# Patient Record
Sex: Male | Born: 1965
Health system: Southern US, Community
[De-identification: ages and names within clinical notes are randomized; demographics above are authoritative.]

## PROBLEM LIST (undated history)

## (undated) DIAGNOSIS — K219 Gastro-esophageal reflux disease without esophagitis: Secondary | ICD-10-CM

## (undated) DIAGNOSIS — E785 Hyperlipidemia, unspecified: Secondary | ICD-10-CM

## (undated) DIAGNOSIS — K200 Eosinophilic esophagitis: Secondary | ICD-10-CM

## (undated) DIAGNOSIS — F419 Anxiety disorder, unspecified: Secondary | ICD-10-CM

## (undated) HISTORY — DX: Hyperlipidemia, unspecified: E78.5

## (undated) HISTORY — DX: Gastro-esophageal reflux disease without esophagitis: K21.9

## (undated) HISTORY — DX: Anxiety disorder, unspecified: F41.9

## (undated) HISTORY — DX: Eosinophilic esophagitis: K20.0

---

## 1987-05-01 HISTORY — PX: INGUINAL HERNIA REPAIR: SUR1180

## 2007-05-08 ENCOUNTER — Ambulatory Visit: Payer: Self-pay | Admitting: Family Medicine

## 2007-05-08 DIAGNOSIS — F40298 Other specified phobia: Secondary | ICD-10-CM | POA: Insufficient documentation

## 2007-05-08 DIAGNOSIS — K219 Gastro-esophageal reflux disease without esophagitis: Secondary | ICD-10-CM

## 2007-05-08 DIAGNOSIS — E785 Hyperlipidemia, unspecified: Secondary | ICD-10-CM

## 2007-05-08 DIAGNOSIS — J309 Allergic rhinitis, unspecified: Secondary | ICD-10-CM

## 2007-05-08 LAB — CONVERTED CEMR LAB
AST: 29 units/L (ref 0–37)
Albumin: 4.3 g/dL (ref 3.5–5.2)
Alkaline Phosphatase: 56 units/L (ref 39–117)
BUN: 15 mg/dL (ref 6–23)
Basophils Absolute: 0 10*3/uL (ref 0.0–0.1)
Chloride: 102 meq/L (ref 96–112)
Cholesterol: 184 mg/dL (ref 0–200)
Creatinine, Ser: 1 mg/dL (ref 0.4–1.5)
HCT: 42 % (ref 39.0–52.0)
LDL Cholesterol: 110 mg/dL — ABNORMAL HIGH (ref 0–99)
MCHC: 34.6 g/dL (ref 30.0–36.0)
Neutrophils Relative %: 52.5 % (ref 43.0–77.0)
Nitrite: NEGATIVE
Protein, U semiquant: NEGATIVE
RBC: 4.75 M/uL (ref 4.22–5.81)
RDW: 11.9 % (ref 11.5–14.6)
Sodium: 139 meq/L (ref 135–145)
Total Bilirubin: 1 mg/dL (ref 0.3–1.2)
Total CHOL/HDL Ratio: 3.7
Urobilinogen, UA: 0.2
VLDL: 25 mg/dL (ref 0–40)
WBC Urine, dipstick: NEGATIVE

## 2007-05-29 ENCOUNTER — Ambulatory Visit: Payer: Self-pay | Admitting: Gastroenterology

## 2007-06-06 ENCOUNTER — Ambulatory Visit: Payer: Self-pay | Admitting: Gastroenterology

## 2007-07-02 DIAGNOSIS — J029 Acute pharyngitis, unspecified: Secondary | ICD-10-CM

## 2007-07-04 ENCOUNTER — Ambulatory Visit: Payer: Self-pay | Admitting: Family Medicine

## 2007-07-04 DIAGNOSIS — L738 Other specified follicular disorders: Secondary | ICD-10-CM

## 2007-07-10 ENCOUNTER — Encounter: Payer: Self-pay | Admitting: Family Medicine

## 2007-07-15 ENCOUNTER — Ambulatory Visit: Payer: Self-pay | Admitting: Gastroenterology

## 2007-09-05 ENCOUNTER — Ambulatory Visit: Payer: Self-pay | Admitting: Gastroenterology

## 2007-10-22 ENCOUNTER — Encounter: Payer: Self-pay | Admitting: Family Medicine

## 2007-11-06 DIAGNOSIS — K200 Eosinophilic esophagitis: Secondary | ICD-10-CM

## 2007-11-07 ENCOUNTER — Ambulatory Visit: Payer: Self-pay | Admitting: Gastroenterology

## 2007-12-09 ENCOUNTER — Telehealth: Payer: Self-pay | Admitting: Gastroenterology

## 2008-06-11 ENCOUNTER — Telehealth: Payer: Self-pay | Admitting: *Deleted

## 2008-07-14 ENCOUNTER — Ambulatory Visit: Payer: Self-pay | Admitting: Family Medicine

## 2008-07-14 LAB — CONVERTED CEMR LAB
Alkaline Phosphatase: 45 units/L (ref 39–117)
Basophils Relative: 1.1 % (ref 0.0–3.0)
Bilirubin, Direct: 0 mg/dL (ref 0.0–0.3)
Blood in Urine, dipstick: NEGATIVE
Calcium: 9.3 mg/dL (ref 8.4–10.5)
Creatinine, Ser: 1 mg/dL (ref 0.4–1.5)
Eosinophils Absolute: 0.4 10*3/uL (ref 0.0–0.7)
Eosinophils Relative: 10.1 % — ABNORMAL HIGH (ref 0.0–5.0)
HDL: 46.8 mg/dL (ref >39.00)
Ketones, urine, test strip: NEGATIVE
LDL Cholesterol: 119 mg/dL — ABNORMAL HIGH (ref 0–99)
Lymphocytes Relative: 32.1 % (ref 12.0–46.0)
MCHC: 34.8 g/dL (ref 30.0–36.0)
Neutrophils Relative %: 46.7 % (ref 43.0–77.0)
Nitrite: NEGATIVE
Platelets: 176 10*3/uL (ref 150.0–400.0)
RBC: 4.45 M/uL (ref 4.22–5.81)
Total Bilirubin: 0.7 mg/dL (ref 0.3–1.2)
Total CHOL/HDL Ratio: 4
Total Protein: 7.2 g/dL (ref 6.0–8.3)
Triglycerides: 109 mg/dL (ref 0.0–149.0)
Urobilinogen, UA: 0.2
WBC: 4 10*3/uL — ABNORMAL LOW (ref 4.5–10.5)

## 2008-07-26 ENCOUNTER — Ambulatory Visit: Payer: Self-pay | Admitting: Family Medicine

## 2009-03-30 ENCOUNTER — Telehealth (INDEPENDENT_AMBULATORY_CARE_PROVIDER_SITE_OTHER): Payer: Self-pay | Admitting: *Deleted

## 2009-03-30 ENCOUNTER — Telehealth: Payer: Self-pay | Admitting: Family Medicine

## 2009-04-01 ENCOUNTER — Telehealth: Payer: Self-pay | Admitting: Family Medicine

## 2009-04-05 ENCOUNTER — Telehealth: Payer: Self-pay | Admitting: Family Medicine

## 2010-02-28 ENCOUNTER — Ambulatory Visit: Payer: Self-pay | Admitting: Family Medicine

## 2010-02-28 LAB — CONVERTED CEMR LAB
ALT: 25 units/L (ref 0–53)
Alkaline Phosphatase: 47 units/L (ref 39–117)
Basophils Relative: 0.6 % (ref 0.0–3.0)
Bilirubin Urine: NEGATIVE
Bilirubin, Direct: 0.1 mg/dL (ref 0.0–0.3)
Calcium: 9.5 mg/dL (ref 8.4–10.5)
Chloride: 101 meq/L (ref 96–112)
Creatinine, Ser: 1 mg/dL (ref 0.4–1.5)
Eosinophils Relative: 10.4 % — ABNORMAL HIGH (ref 0.0–5.0)
GFR calc non Af Amer: 87.67 mL/min (ref >60)
HDL: 56.4 mg/dL (ref >39.00)
Hemoglobin, Urine: NEGATIVE
LDL Cholesterol: 115 mg/dL — ABNORMAL HIGH (ref 0–99)
Lymphocytes Relative: 26.7 % (ref 12.0–46.0)
Monocytes Relative: 8.3 % (ref 3.0–12.0)
Neutrophils Relative %: 54 % (ref 43.0–77.0)
Nitrite: NEGATIVE
PSA: 0.46 ng/mL (ref 0.10–4.00)
Platelets: 210 10*3/uL (ref 150.0–400.0)
RBC: 4.4 M/uL (ref 4.22–5.81)
Total Bilirubin: 0.9 mg/dL (ref 0.3–1.2)
Total CHOL/HDL Ratio: 3
Total Protein, Urine: NEGATIVE mg/dL
Total Protein: 7.3 g/dL (ref 6.0–8.3)
Triglycerides: 126 mg/dL (ref 0.0–149.0)
Urine Glucose: NEGATIVE mg/dL
VLDL: 25.2 mg/dL (ref 0.0–40.0)
WBC: 5.1 10*3/uL (ref 4.5–10.5)
pH: 6 (ref 5.0–8.0)

## 2010-03-13 ENCOUNTER — Encounter: Payer: Self-pay | Admitting: Family Medicine

## 2010-03-13 ENCOUNTER — Ambulatory Visit: Payer: Self-pay | Admitting: Family Medicine

## 2010-05-30 NOTE — Assessment & Plan Note (Signed)
Summary: cpx/cjr   Vital Signs:  Patient profile:   45 year old male Height:      70.5 inches Weight:      193 pounds BMI:     27.40 Temp:     98.3 degrees F oral BP sitting:   132 / 92  (left arm) Cuff size:   regular  Vitals Entered By: Kern Reap CMA Duncan Dull) (March 13, 2010 3:55 PM) CC: cpx Is Patient Diabetic? No Pain Assessment Patient in pain? no        Primary Care Provider:  Kelle Darting, M.D.  CC:  cpx.  History of Present Illness: Jeremy Paul is a 45 year old, married male, nonsmoker, who comes in today for general physical evaluation because of a history of underlying allergic rhinitis, hyperlipidemia, occasional breakout with cold sores, occasional anxiety with flying and chronic reflux esophagitis.  For allergic rhinitis.  He takes Flonase nasal spray nightly  For hyperlipidemia.  He takes an aspirin tablet daily, along with 40 mg of Zocor.  Lipids are normal  He takes Valtrex p.r.n.  He takes Ativan  .5 mg 30 minutes before flying.  He takes kapidex one daily for chronic reflux esophagitis.  He must take his medicine daily.  He has a condition called eosinophilic esophagitis, and if he doesn't take his medicine he developed of esophageal strictures.  Tetanus 2010 seasonal flu shot 2011.  Review of systems negative except he recently had some constipation, which resulted in some bright red rectal bleeding is resolving.  Advised if that does recur to see his GI  Allergies: No Known Drug Allergies  Past History:  Past medical, surgical, family and social histories (including risk factors) reviewed, and no changes noted (except as noted below).  Past Medical History: Reviewed history from 05/08/2007 and no changes required. Allergic rhinitis Anxiety flying  GERD Hyperlipidemia right inguinal hernia 1989, repaired surgically upper endoscopy, 96, and 99 for strictures palpitations.  Cardiac workup negative herpes simplex, episodic  Past Surgical  History: Reviewed history from 11/07/2007 and no changes required. Hernia Surgery  rt.  Family History: Reviewed history from 05/08/2007 and no changes required. Family History High cholesterol Family History Hypertension Family History Thyroid disease Family History of Alcoholism/Addiction brother  Social History: Reviewed history from 11/07/2007 and no changes required. 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 No apparent alopecia or balding. Eyes:  No corneal or conjunctival inflammation noted. EOMI. Perrla. Funduscopic exam benign, without hemorrhages, exudates or papilledema. Vision grossly normal. Ears:  External ear exam shows no significant lesions or deformities.  Otoscopic examination reveals clear canals, tympanic membranes are intact bilaterally without bulging, retraction, inflammation or discharge. Hearing is grossly normal bilaterally. Nose:  External nasal examination shows no deformity or inflammation. Nasal mucosa are pink and moist without lesions or exudates. Mouth:  Oral mucosa and oropharynx without lesions or exudates.  Teeth in good repair. Neck:  No deformities, masses, or tenderness noted. Chest Wall:  No deformities, masses, tenderness or gynecomastia noted. Breasts:  No masses or gynecomastia noted Lungs:  Normal respiratory effort, chest expands symmetrically. Lungs are clear to auscultation, no crackles or wheezes. Heart:  Normal rate and regular rhythm. S1 and S2 normal without gallop, murmur, click, rub or other extra  sounds. Abdomen:  Bowel sounds positive,abdomen soft and non-tender without masses, organomegaly or  hernias noted. Rectal:  No external abnormalities noted. Normal sphincter tone. No rectal masses or tenderness...Marland KitchenMarland KitchenBrown stool, for slightly blue.  Patient admits to having constipation with some bleeding from straining Genitalia:  Testes bilaterally descended without nodularity, tenderness or masses. No scrotal masses or lesions. No penis lesions or urethral discharge. Prostate:  Prostate gland firm and smooth, no enlargement, nodularity, tenderness, mass, asymmetry or induration. Msk:  No deformity or scoliosis noted of thoracic or lumbar spine.   Pulses:  R and L carotid,radial,femoral,dorsalis pedis and posterior tibial pulses are full and equal bilaterally Extremities:  No clubbing, cyanosis, edema, or deformity noted with normal full range of motion of all joints.   Neurologic:  No cranial nerve deficits noted. Station and gait are normal. Plantar reflexes are down-going bilaterally. DTRs are symmetrical throughout. Sensory, motor and coordinative functions appear intact. Skin:  Intact without suspicious lesions or rashes Cervical Nodes:  No lymphadenopathy noted Axillary Nodes:  No palpable lymphadenopathy Inguinal Nodes:  No significant adenopathy Psych:  Cognition and judgment appear intact. Alert and cooperative with normal attention span and concentration. No apparent delusions, illusions, hallucinations   Impression & Recommendations:  Problem # 1:  HYPERLIPIDEMIA (ICD-272.4) Assessment Improved  His updated medication list for this problem includes:    Zocor 40 Mg Tabs (Simvastatin) .Marland Kitchen... 1 tab @ bedtime  Orders: Prescription Created Electronically [phone]) EKG w/ Interpretation (93000)  Problem # 2:  ALLERGIC RHINITIS (ICD-477.9) Assessment: Improved  His updated medication list for this problem includes:    Flonase 50 Mcg/act Susp (Fluticasone propionate) ..... Uad as needed--about q3days  Orders: Prescription Created Electronically [phone])  Problem # 3:  Preventive  Health Care (ICD-V70.0) Assessment: Unchanged  Complete Medication List: 1)  Bayer Aspirin 325 Mg Tabs (Aspirin) .... Once daily 2)  Flonase 50 Mcg/act Susp (Fluticasone propionate) .... Uad as needed--about q3days 3)  Zocor 40 Mg Tabs (Simvastatin) .Marland Kitchen.. 1 tab @ bedtime 4)  Valtrex 1 Gm Tabs (Valacyclovir hcl) .... One two times a day prn 5)  Alprazolam 0.5 Mg Tabs (Alprazolam) .... One 30 min < flying 6)  Kapidex 60 Mg Cpdr (Dexlansoprazole) .... Take one capsule by mouth 30 minutes before breakfast  Patient Instructions: 1)  continue your current medications pain.......... if the bleeding does not resolve, then see Dr. Jarold Motto for further evaluation.  Also, asked them about the issue with the magnesium 2)  Please schedule a follow-up appointment in 1 year. 3)  For the soreness in y  left elbow............  We would recommend Motrin 600 mg twice daily with food, the elastic brace, and elevation and ice 15 minutes prior to bedtime 4)  Also, OTC cortisone cream for the itching in the right ear canal Prescriptions: VALTREX 1 GM  TABS (VALACYCLOVIR HCL) one two times a day prn  #60 Each x 2   Entered and Authorized by:   Roderick Pee MD   Signed by:   Roderick Pee MD on 03/13/2010   Method used:   Faxed to ...       [facility] (mail-order)             , [place]         Ph: [phone]       Fax: [phone]   RxID:   [id number] ZOCOR 40 MG  TABS (SIMVASTATIN) 1 tab @ bedtime  #100 x 3   Entered and Authorized by:   Tinnie Gens  Shawnie Dapper MD   Signed by:   Roderick Pee MD on 03/13/2010   Method used:   Faxed to ...       [facility] (mail-order)             , [place]         Ph: [phone]       Fax: [phone]   RxID:   [id number] FLONASE 50 MCG/ACT  SUSP (FLUTICASONE PROPIONATE) UAD as needed--about q3days  #3 x 3   Entered and Authorized by:   Roderick Pee MD   Signed by:   Roderick Pee MD on 03/13/2010   Method used:   Faxed to ...       [facility] (mail-order)             ,  [place]         Ph: [phone]       Fax: [phone]   RxID:   [id number] ALPRAZOLAM 0.5 MG  TABS (ALPRAZOLAM) one 30 min < flying  #30 x 1   Entered and Authorized by:   Roderick Pee MD   Signed by:   Roderick Pee MD on 03/13/2010   Method used:   Print then Give to Patient   RxID:   [id number] VALTREX 1 GM  TABS (VALACYCLOVIR HCL) one two times a day prn  #60 Each x 2   Entered and Authorized by:   Roderick Pee MD   Signed by:   Roderick Pee MD on 03/13/2010   Method used:   Electronically to        [facility] [address] [phone]* (retail)       [address]       [place], [place]  [address]       Ph: [phone] or [phone]       Fax: [phone]   RxID:   [id number] ZOCOR 40 MG  TABS (SIMVASTATIN) 1 tab @ bedtime  #100 x 3   Entered and Authorized by:   Roderick Pee MD   Signed by:   Roderick Pee MD on 03/13/2010   Method used:   Electronically to        [facility] [address] [phone]* (retail)       [address]       [place], [place]  [address]       Ph: [phone] or [phone]       Fax: [phone]   RxID:   [id number] FLONASE 50 MCG/ACT  SUSP (FLUTICASONE PROPIONATE) UAD as needed--about q3days  #3 x 3   Entered and Authorized by:   Roderick Pee MD   Signed by:   Roderick Pee MD on 03/13/2010   Method used:   Electronically to        [facility] [address] [phone]* (retail)       [address]       [place], [place]  [address]       Ph: [phone] or [phone]       Fax: [phone]   RxID:   [id number]    Orders Added: 1)  Prescription Created Electronically [phone] 2)  Est. Patient 40-64 years [99396] 3)  EKG w/ Interpretation [93000]   Immunization History:  Influenza Immunization History:    Influenza:  historical (01/28/2010)   Immunization History:  Influenza Immunization History:    Influenza:  Historical (01/28/2010)

## 2010-09-12 NOTE — Assessment & Plan Note (Signed)
[facility]                         GASTROENTEROLOGY OFFICE NOTE   EMMANUAL, GAUTHREAUX                           MRN:          [id number]  DATE:07/15/2007                            DOB:          09-30-1965    HISTORY:  Jeremy Paul is asymptomatic at this time after his endoscopy and  esophageal dilatation on June 06, 2007.  He had endoscopic changes of  eosinophilic esophagitis, confirmed by esophageal biopsies.   I reviewed his findings with Brennon in detail, and placed him on  fluticasone inhaler, four puffs three times daily.  We will see how he  does symptomatically.  He is to continue this for six weeks.  I will see  him back at that time and we will decide how to proceed for this most  unusual but newly recognized condition of eosinophilic esophagitis.     Vania Rea. Jarold Motto, MD, Caleen Essex, FAGA  Electronically Signed    DRP/MedQ  DD: 07/15/2007  DT: 07/16/2007  Job #: [id number]   cc:   Tinnie Gens A. Tawanna Cooler, MD

## 2010-09-12 NOTE — Assessment & Plan Note (Signed)
[facility]                         GASTROENTEROLOGY OFFICE NOTE   [name], [name]                           MRN:          [id number]  DATE:09/05/2007                            DOB:          12-26-1965    Jeremy Paul is doing well on his steroid inhaler with the spacer removed.  However, he is only able to do this twice a day.  He has occasional  breakthrough acid reflux for which he takes over-the-counter H2-  blockers.  He denies chest pain or dysphagia.   We had a long discussion today concerning his issues and we will  continue his oral steroid spray without the spacer with this solution  being swallowed at least 2-3 times a day with office followup in 2  months' time.     Vania Rea. Jarold Motto, MD, Caleen Essex, FAGA  Electronically Signed    DRP/MedQ  DD: 09/05/2007  DT: 09/05/2007  Job #: [id number]

## 2010-09-12 NOTE — Assessment & Plan Note (Signed)
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 Tawanna Cooler for evaluation of chronic GERD.   Mr. Graybeal has had acid reflux for approximately 12 years and first had  endoscopy and esophageal dilatation in [place] in 1996.  At that time  he was placed on Prevacid and had good response to medication therapy  and has been on and off of different PPI medications over the last 12  years.  He apparently had another endoscopy with dilatation in 2000.  Apparently at that time he was having dysphagia.  I do not have these  records for review.   In any case, he currently is doing well but if he did not take over-the-  counter Prilosec daily he would have severe burning, substernal chest  pain and regurgitation.  He has noticed intermittent solid food  dysphagia, eats slowly and follows a low fiber diet.  He has no lower GI  or hepatobiliary complaints.  He does have problems with allergies and  takes Flonase and Zyrtec.  The patient relates that in the past he did  have a 24-hour pH probe test while on twice a day Prevacid with good  acid control.   The patient eats a regular diet, has had no significant weight gain and  does not abuse alcohol or cigarettes.  He is followed by Dr. Tawanna Cooler and  recently had a complete physical exam which otherwise was fairly  unremarkable.  The patient does take Valtrex daily 1 gram, Zocor 40 mg a  day, and Flonase.  Recent laboratory testing in January showed a normal  urinalysis, CBC, metabolic profile.   FAMILY HISTORY:  1. Remarkable for colon polyps in his grandparents in their elder      years.  2. There is a family of atherogenesis.   SOCIAL HISTORY:  1. He is married.  2. He lives with his wife.  3. He had 3 children.  4. He had a Master's Degree in Health Care Management.  5. He does not smoke.  6. Uses 1 to 2 glasses of wine a day with dinner.   REVIEW OF SYSTEMS:  Noncontributory.   EXAM:  He is a healthy-appearing white male in no distress, appearing  his stated age.  He is 5 feet 11 inches and weighs 197 pounds.  Blood  pressure is 120/84 and pulse was 72 and regular.  I could not appreciate  stigmata of chronic liver disease or thyromegaly.  CHEST:  Clear.  He is a regular rhythm without murmurs, gallops or rubs.  I could not appreciate hepatosplenomegaly, abdominal masses or  tenderness.  Bowel sounds were normal.  PERIPHERAL EXTREMITIES:  Unremarkable.  MENTAL STATUS:  Clear.   ASSESSMENT:  I suspect Mr. Mcqueen has a moderate sized hiatal hernia and  chronic acid reflux.  He needs to have exclusion of Barrett's mucosa.  He needs examination of his esophagus narrow band imaging.  He also will  need random esophageal biopsies to exclude eosinophilic esophagitis.  He  will need esophageal dilatation depending on the results of his  endoscopy exam.  I think with his chronic history he probably is not  getting adequate acid suppression with over-the-counter Prilosec 20 mg.   RECOMMENDATIONS:  1. Switch to Aciphex 20 mg a day 30 minutes before breakfast and use      it twice a day as needed.  2. Strict antireflux regimen reviewed with patient.  3. Outpatient endoscopy, esophageal biopsies and possible dilatation.     Vania Rea. Jarold Motto, MD, Caleen Essex, FAGA  Electronically Signed    DRP/MedQ  DD: 05/29/2007  DT: 05/29/2007  Job #: [id number]   cc:   Tinnie Gens A. Tawanna Cooler, MD

## 2011-01-22 ENCOUNTER — Encounter: Payer: Self-pay | Admitting: Gastroenterology

## 2011-02-13 ENCOUNTER — Encounter: Payer: Self-pay | Admitting: Gastroenterology

## 2011-02-13 ENCOUNTER — Ambulatory Visit (INDEPENDENT_AMBULATORY_CARE_PROVIDER_SITE_OTHER): Payer: 59 | Admitting: Gastroenterology

## 2011-02-13 VITALS — BP 112/72 | HR 64 | Ht 71.0 in | Wt 187.2 lb

## 2011-02-13 DIAGNOSIS — Z8371 Family history of colonic polyps: Secondary | ICD-10-CM

## 2011-02-13 DIAGNOSIS — Z83719 Family history of colon polyps, unspecified: Secondary | ICD-10-CM

## 2011-02-13 DIAGNOSIS — K219 Gastro-esophageal reflux disease without esophagitis: Secondary | ICD-10-CM

## 2011-02-13 DIAGNOSIS — K200 Eosinophilic esophagitis: Secondary | ICD-10-CM

## 2011-02-13 MED ORDER — PEG-KCL-NACL-NASULF-NA ASC-C 100 G PO SOLR: 1.0000 | Freq: Once | ORAL | Status: DC

## 2011-02-13 NOTE — Progress Notes (Signed)
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 I previously saw him 3 years ago for acid reflux symptoms and dysphagia. Endoscopy showed an esophageal stricture which was successfully dilated. Biopsies suggested eosinophilic esophagitis, and the patient asymptomatic on Prevacid 15 mg a day and Flonase spray twice a day and a daily antihistamine. Recent lab data was reviewed from one year ago and was normal. He denies lower gastrointestinal problems, but apparently had an episode of proctitis 5-6 years ago with colonoscopy in [place] at that time. This report is not available for review. He does have a family history of polyps in his mother. He specifically denies melena, hematochezia, lower abdominal pain, or any hepatobiliary or systemic complaints. His appetite is good his weight is stable. He is followed medically by Dr. Kelle Darting, and the patient takes aspirin 325 mg a day and Zocor 5 mg a day. He does not smoke, abuse ethanol or NSAIDs.  He specifically denies chest pain, dysphagia, regurgitation, nocturnal awakening, history of gallbladder or liver disease.   Current Medications, Allergies, Past Medical History, Past Surgical History, Family History and Social History were reviewed in [employer] record.  Pertinent Review of Systems Negative... no symptoms of Raynaud's phenomenon or collagen vascular disease. He does have some nonspecific low back pain. He specifically denies any cardiovascular or pulmonary complaints otherwise. 10 point review of systems otherwise negative.   Physical Exam: Healthy-appearing white male in no acute distress. I cannot appreciate stigmata of chronic liver disease. Chest is clear he appears to be in a regular rhythm without murmurs gallops or rubs. I cannot appreciate splenomegaly, abdominal masses, hepatomegaly, or tenderness. Bowel sounds are normal. Mental status is  normal and peripheral extremities are unremarkable.   Assessment and Plan: GERD well controlled with over-the-counter daily PPI therapy. Patient also has an element of eosinophilic esophagitis well managed  with corticosteroid nasal spray and po antihistamines. We have reviewed a reflux regime and will continue current medications. He is to call if he has worsening of his symptoms or any dysphagia. Positive family history of colon polyps I have scheduled him for followup colonoscopy at his convenience. Asked to continue other medications as per Dr. Tawanna Cooler. No diagnosis found.

## 2011-02-13 NOTE — Patient Instructions (Signed)
You have been scheduled for a colonoscopy. Please follow written instructions given to you at your visit today.  Please pick up your Moviprep kit at the pharmacy within the next 2-3 days.  

## 2011-02-21 ENCOUNTER — Encounter: Payer: Self-pay | Admitting: Family Medicine

## 2011-02-21 ENCOUNTER — Ambulatory Visit (INDEPENDENT_AMBULATORY_CARE_PROVIDER_SITE_OTHER): Payer: 59 | Admitting: Family Medicine

## 2011-02-21 DIAGNOSIS — G43709 Chronic migraine without aura, not intractable, without status migrainosus: Secondary | ICD-10-CM | POA: Insufficient documentation

## 2011-02-21 DIAGNOSIS — J3081 Allergic rhinitis due to animal (cat) (dog) hair and dander: Secondary | ICD-10-CM

## 2011-02-21 MED ORDER — FLUTICASONE PROPIONATE 50 MCG/ACT NA SUSP: 2.0000 | Freq: Every day | NASAL | Status: DC

## 2011-02-21 NOTE — Patient Instructions (Signed)
Take 600 mg of Motrin two hours prior to exercise.  Return p.r.n.

## 2011-02-21 NOTE — Progress Notes (Signed)
  Subjective:    Patient ID: Jeremy Paul, male    DOB: November 19, 1965, 45 y.o.   MRN: [id number]  Jeremy Paul is a 45 year old male, who comes in today for evaluation of headaches.  He has begun an exercise program and he notices when he lifts weights he began to have a right occipital headache.  Does sometimes have some photophobia .Marland Kitchen  Review of systems otherwise negative.  Family history mother has a history of migraines.  He has no neurologic symptoms.  The headaches resolved in a short period of time with no therapy    Review of Systems    General and neurologic review of systems negative Objective:   Physical Exam  Well-developed well-nourished man in no acute distress.  Examination of the HEENT were negative.  The neck was supple.  Neurologic exam cranial nerves two through 12 were intact.  No papilledema.  Strength reflexes, sensation, gait, cerebellar testing are all normal      Assessment & Plan:  Exercise-induced migraine headaches.  Plan 600 mg of Motrin prior to exercise.  Return p.r.n.

## 2011-02-26 ENCOUNTER — Other Ambulatory Visit: Payer: Self-pay | Admitting: Family Medicine

## 2011-02-28 ENCOUNTER — Other Ambulatory Visit: Payer: 59 | Admitting: Gastroenterology

## 2011-03-05 ENCOUNTER — Telehealth: Payer: Self-pay | Admitting: *Deleted

## 2011-03-05 DIAGNOSIS — G43709 Chronic migraine without aura, not intractable, without status migrainosus: Secondary | ICD-10-CM

## 2011-03-05 NOTE — Telephone Encounter (Signed)
Pt is still having the exact same headaches as before when he saw Dr. Tawanna Cooler.  Suggestions?

## 2011-03-05 NOTE — Telephone Encounter (Signed)
Referred to Dr. Denton Meek our neurologist for further consultation

## 2011-03-07 NOTE — Telephone Encounter (Signed)
Left message on machine for patient  And referral request sent

## 2011-03-08 ENCOUNTER — Encounter: Payer: Self-pay | Admitting: Neurology

## 2011-04-06 ENCOUNTER — Ambulatory Visit (INDEPENDENT_AMBULATORY_CARE_PROVIDER_SITE_OTHER): Payer: 59 | Admitting: Neurology

## 2011-04-06 ENCOUNTER — Encounter: Payer: Self-pay | Admitting: Neurology

## 2011-04-06 DIAGNOSIS — R51 Headache: Secondary | ICD-10-CM
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
## 2011-04-06 NOTE — Patient Instructions (Signed)
Your MRI and MRA are scheduled for Friday, December 14th at 4:00pm.  Please arrive by 3:45pm.  First floor admitting.

## 2011-04-11 ENCOUNTER — Other Ambulatory Visit: Payer: Self-pay | Admitting: Family Medicine

## 2011-04-12 ENCOUNTER — Other Ambulatory Visit: Payer: Self-pay

## 2011-04-13 ENCOUNTER — Other Ambulatory Visit (HOSPITAL_COMMUNITY): Payer: 59

## 2011-04-17 ENCOUNTER — Telehealth: Payer: Self-pay | Admitting: Neurology

## 2011-04-17 NOTE — Telephone Encounter (Signed)
Pt would like results of MRI

## 2011-04-18 NOTE — Telephone Encounter (Signed)
Jan - same request.  Jeremy Paul had his images done at [facility], could you request the disks for me.  Thx.

## 2011-04-18 NOTE — Telephone Encounter (Signed)
Fwd to Dr. Modesto Charon for review.

## 2011-04-18 NOTE — Telephone Encounter (Signed)
Spoke with Liborio Nixon at [facility]. The CD will go out tomorrow.  **Dr. Modesto Charon....FYI.  Jan

## 2011-04-19 ENCOUNTER — Other Ambulatory Visit (INDEPENDENT_AMBULATORY_CARE_PROVIDER_SITE_OTHER): Payer: 59

## 2011-04-19 DIAGNOSIS — Z0000 Encounter for general adult medical examination without abnormal findings: Secondary | ICD-10-CM

## 2011-04-19 DIAGNOSIS — E291 Testicular hypofunction: Secondary | ICD-10-CM

## 2011-04-19 LAB — HEPATIC FUNCTION PANEL
AST: 24 U/L (ref 0–37)
Alkaline Phosphatase: 43 U/L (ref 39–117)
Bilirubin, Direct: 0.1 mg/dL (ref 0.0–0.3)

## 2011-04-19 LAB — POCT URINALYSIS DIPSTICK
Leukocytes, UA: NEGATIVE
Nitrite, UA: NEGATIVE
Protein, UA: NEGATIVE
Urobilinogen, UA: 0.2
pH, UA: 6

## 2011-04-19 LAB — LIPID PANEL
HDL: 56.6 mg/dL (ref >39.00)
Total CHOL/HDL Ratio: 3
VLDL: 15.6 mg/dL (ref 0.0–40.0)

## 2011-04-19 LAB — BASIC METABOLIC PANEL
CO2: 29 mEq/L (ref 19–32)
Calcium: 9.1 mg/dL (ref 8.4–10.5)
Creatinine, Ser: 1.2 mg/dL (ref 0.4–1.5)
GFR: 70.53 mL/min (ref >60.00)
Sodium: 139 mEq/L (ref 135–145)

## 2011-04-19 LAB — TSH: TSH: 1.81 u[IU]/mL (ref 0.35–5.50)

## 2011-04-19 LAB — CBC WITH DIFFERENTIAL/PLATELET
Basophils Absolute: 0 10*3/uL (ref 0.0–0.1)
Basophils Relative: 0.3 % (ref 0.0–3.0)
Eosinophils Absolute: 0.2 10*3/uL (ref 0.0–0.7)
Hemoglobin: 14 g/dL (ref 13.0–17.0)
Lymphocytes Relative: 37.8 % (ref 12.0–46.0)
MCHC: 34.3 g/dL (ref 30.0–36.0)
Monocytes Relative: 10.7 % (ref 3.0–12.0)
Neutro Abs: 1.7 10*3/uL (ref 1.4–7.7)
Neutrophils Relative %: 46.6 % (ref 43.0–77.0)
RBC: 4.47 Mil/uL (ref 4.22–5.81)
RDW: 13.2 % (ref 11.5–14.6)

## 2011-04-27 ENCOUNTER — Other Ambulatory Visit: Payer: Self-pay

## 2011-04-27 DIAGNOSIS — I729 Aneurysm of unspecified site: Secondary | ICD-10-CM

## 2011-04-27 DIAGNOSIS — R51 Headache: Secondary | ICD-10-CM

## 2011-04-27 NOTE — Telephone Encounter (Signed)
MRA reveals a possible small aneurysm at the bifurcation of the left ICA.  I have told patient that we need at CTA of the head to assess.  I don't think this is related to his exertional headache.  Tiffany - Could you set up a CTA just of the head(no neck) whenever possible.  If you can see if it can be done at [facility] or [facility] and not [facility] that would be great, but if not that is ok too.

## 2011-04-30 NOTE — Telephone Encounter (Signed)
Appt 05/03/11 at 9:45 [facility] at pt's request.   Left msg Friday on home phone and today on cell phone.  Left appt info on cell phone.

## 2011-05-03 ENCOUNTER — Ambulatory Visit (INDEPENDENT_AMBULATORY_CARE_PROVIDER_SITE_OTHER): Payer: 59 | Admitting: Family Medicine

## 2011-05-03 ENCOUNTER — Encounter: Payer: Self-pay | Admitting: Family Medicine

## 2011-05-03 DIAGNOSIS — G43709 Chronic migraine without aura, not intractable, without status migrainosus: Secondary | ICD-10-CM

## 2011-05-03 DIAGNOSIS — J309 Allergic rhinitis, unspecified: Secondary | ICD-10-CM

## 2011-05-03 DIAGNOSIS — F411 Generalized anxiety disorder: Secondary | ICD-10-CM

## 2011-05-03 DIAGNOSIS — E785 Hyperlipidemia, unspecified: Secondary | ICD-10-CM

## 2011-05-03 DIAGNOSIS — K219 Gastro-esophageal reflux disease without esophagitis: Secondary | ICD-10-CM

## 2011-05-03 DIAGNOSIS — Z0000 Encounter for general adult medical examination without abnormal findings: Secondary | ICD-10-CM

## 2011-05-03 DIAGNOSIS — K200 Eosinophilic esophagitis: Secondary | ICD-10-CM

## 2011-05-03 MED ORDER — LORAZEPAM 0.5 MG PO TABS: ORAL_TABLET | ORAL | Status: DC

## 2011-05-03 MED ORDER — SIMVASTATIN 40 MG PO TABS: 40.0000 mg | ORAL_TABLET | Freq: Every day | ORAL | Status: DC

## 2011-05-03 MED ORDER — VALACYCLOVIR HCL 1 G PO TABS: 1000.0000 mg | ORAL_TABLET | Freq: Two times a day (BID) | ORAL | Status: DC

## 2011-05-03 MED ORDER — FLUTICASONE PROPIONATE 50 MCG/ACT NA SUSP: 1.0000 | Freq: Every day | NASAL | Status: DC

## 2011-05-03 MED ORDER — LANSOPRAZOLE 15 MG PO CPDR: 15.0000 mg | DELAYED_RELEASE_CAPSULE | Freq: Every day | ORAL | Status: DC

## 2011-05-03 NOTE — Progress Notes (Signed)
  Subjective:    Patient ID: Jeremy Paul, male    DOB: [birth year], 46 y.o.   MRN: [id number]  Jeremy Paul  is a 46 year old, married male, nonsmoker Who comes in today for a general physical examination because of a history of allergic rhinitis, reflux, esophagitis, hyperlipidemia, recurrent episodic, HSV, migraine headaches.  His medications reviewed in detail and have been no significant changes.  He would like a prescription for Ativan for flying anxiety.  He is in the process of having an evaluation by Dr. Molli Paul w............ The headaches appear to be exercise-induced migraines.  Neurologic exam was negative.  MRI showed a lesion.  They, think it's not significant, however, he had a CT A. Recently yesterday, results pending.  He states he feels well.  He is back to exercising and no exercise-induced headaches.    Review of Systems  Constitutional: Negative.   HENT: Negative.   Eyes: Negative.   Respiratory: Negative.   Cardiovascular: Negative.   Gastrointestinal: Negative.   Genitourinary: Negative.   Musculoskeletal: Negative.   Skin: Negative.   Neurological: Negative.   Hematological: Negative.   Psychiatric/Behavioral: Negative.        Objective:   Physical Exam  Constitutional: He is oriented to person, place, and time. He appears well-developed and well-nourished.  HENT:  Head: Normocephalic and atraumatic.  Right Ear: External ear normal.  Left Ear: External ear normal.  Nose: Nose normal.  Mouth/Throat: Oropharynx is clear and moist.  Eyes: Conjunctivae and EOM are normal. Pupils are equal, round, and reactive to light.  Neck: Normal range of motion. Neck supple. No JVD present. No tracheal deviation present. No thyromegaly present.  Cardiovascular: Normal rate, regular rhythm, normal heart sounds and intact distal pulses.  Exam reveals no gallop and no friction rub.   No murmur heard. Pulmonary/Chest: Effort normal and breath sounds normal. No stridor. No  respiratory distress. He has no wheezes. He has no rales. He exhibits no tenderness.  Abdominal: Soft. Bowel sounds are normal. He exhibits no distension and no mass. There is no tenderness. There is no rebound and no guarding.  Genitourinary: Rectum normal, prostate normal and penis normal. Guaiac negative stool. No penile tenderness.  Musculoskeletal: Normal range of motion. He exhibits no edema and no tenderness.  Lymphadenopathy:    He has no cervical adenopathy.  Neurological: He is alert and oriented to person, place, and time. He has normal reflexes. No cranial nerve deficit. He exhibits normal muscle tone.  Skin: Skin is warm and dry. No rash noted. No erythema. No pallor.  Psychiatric: He has a normal mood and affect. His behavior is normal. Judgment and thought content normal.          Assessment & Plan:  Healthy male.  History of headaches, probable exercise-induced migraines, currently asymptomatic.  Allergic rhinitis.  Continue Flonase.  Reflux esophagitis.  Continue Prevacid 15 mg daily.  Hyperlipidemia.  Continue simvastatin, and an aspirin tablet daily.  Recurrent HSV.checks p.r.n.  Follow up in neurology, Dr. Molli Paul   Anxiety related to flying Ativan .5 p.r.n. Hour prior to flying

## 2011-05-03 NOTE — Patient Instructions (Signed)
Continue current medications.  Ativan .5, one at the two hours prior to flying.  Return in one year, sooner for any problems

## 2011-05-04 ENCOUNTER — Encounter: Payer: Self-pay | Admitting: Neurology

## 2011-05-04 NOTE — Progress Notes (Signed)
Got MRI brain and MRA head.  There was ? of left ICA aneurysm at its terminus x 2mm.  CTA was done which did not confirm aneurysm - so I believe intracranial vasculature is normal.

## 2011-05-16 ENCOUNTER — Telehealth: Payer: Self-pay | Admitting: Family Medicine

## 2011-05-16 NOTE — Telephone Encounter (Signed)
Pt would like to come in for testosterone level bloodwork. Can I sch?

## 2011-05-17 NOTE — Telephone Encounter (Signed)
ok 

## 2011-05-18 ENCOUNTER — Telehealth: Payer: Self-pay | Admitting: Family Medicine

## 2011-05-18 NOTE — Telephone Encounter (Signed)
Wants to come in to have testosterone re-checked. Please call pt. And let him know if this is ok.

## 2011-05-21 NOTE — Telephone Encounter (Signed)
ok 

## 2011-05-21 NOTE — Telephone Encounter (Signed)
Lab sch 05-24-2011

## 2011-05-24 ENCOUNTER — Other Ambulatory Visit (INDEPENDENT_AMBULATORY_CARE_PROVIDER_SITE_OTHER): Payer: 59

## 2011-05-24 DIAGNOSIS — E349 Endocrine disorder, unspecified: Secondary | ICD-10-CM

## 2011-05-24 DIAGNOSIS — E291 Testicular hypofunction: Secondary | ICD-10-CM

## 2011-05-24 LAB — TESTOSTERONE: Testosterone: 118.17 ng/dL — ABNORMAL LOW (ref 350.00–890.00)

## 2011-05-29 ENCOUNTER — Ambulatory Visit (INDEPENDENT_AMBULATORY_CARE_PROVIDER_SITE_OTHER): Payer: 59 | Admitting: Family Medicine

## 2011-05-29 ENCOUNTER — Encounter: Payer: Self-pay | Admitting: Family Medicine

## 2011-05-29 VITALS — BP 100/70 | Temp 98.2°F | Wt 191.0 lb

## 2011-05-29 DIAGNOSIS — E291 Testicular hypofunction: Secondary | ICD-10-CM

## 2011-05-29 MED ORDER — TESTOSTERONE 20.25 MG/1.25GM (1.62%) TD GEL: 2.0000 | Freq: Every day | TRANSDERMAL | Status: DC

## 2011-05-29 NOTE — Progress Notes (Signed)
  Subjective:    Patient ID: Jeremy Paul, male    DOB: 1965-11-15, 46 y.o.   MRN: [id number]  HPIBrian is a 46 year old married male nonsmoker who comes in today for evaluation of low testosterone  His testosterone has been documented to be low in the past and is gradually decreasing. It's now down to 118 and on repeat a month apart it is indeed low. At 46.  His symptoms have been gradually increasing as his testosterone level has been decreasing. Symptoms are to fatigue no energy decreased sexual function and  We discussed the etiology of this syndrome. We also discussed all the treatment options including nothing just taking a supplement like Viagra or a trial of testosterone. He elects to try the testosterone supplement daily. I explained to him we would need to follow his blood pressure very carefully and his PSA. He understands that this medication can activate indolent  prostate cancer.    Review of Systems    Gen. And endocrinology review of systems otherwise negative Objective:   Physical Exam  Well-developed well-nourished male in no acute distress      Assessment & Plan:  Low testosterone with hypogonadism plan testosterone supplement PSA today followup testosterone level and office visit in 2 months

## 2011-05-29 NOTE — Patient Instructions (Signed)
Take the prescription to the pharmacy to see if they will fill it  Setup a followup appointment in 2 months  Non-fasting testosterone level one week prior

## 2011-06-04 ENCOUNTER — Telehealth: Payer: Self-pay | Admitting: Family Medicine

## 2011-06-04 DIAGNOSIS — E291 Testicular hypofunction: Secondary | ICD-10-CM

## 2011-06-04 MED ORDER — TESTOSTERONE 20.25 MG/1.25GM (1.62%) TD GEL: 2.0000 | Freq: Every day | TRANSDERMAL | Status: DC

## 2011-06-04 NOTE — Telephone Encounter (Signed)
rx faxed

## 2011-06-04 NOTE — Telephone Encounter (Signed)
Pt contacted his insurance co re: testosterone replacement therapy and was told that pt can try,testosterone (ANDROGEL) 20.25 MG/1.25GM (1.62%) GEL 20/25 mg 1.25 gm (1.62%).  Pt said that he took this script to [facility] and was told that his insurance will not cover, but  Pt says that he is going to try to call [facility] to see if his insurance will cover or not. Pt said that he will call back if there are any problems.

## 2011-06-04 NOTE — Telephone Encounter (Signed)
Disregard previous message. Fax new rx for Androgel 20.25/1.25/1.62 % gel to  [phone]. They will help him pay for this rx at [facility]. Thanks.

## 2011-06-25 ENCOUNTER — Other Ambulatory Visit: Payer: Self-pay | Admitting: Family Medicine

## 2011-06-25 NOTE — Telephone Encounter (Signed)
Pt is calling because [facility] needs direction on androgel reference [id number] and phone # [phone]

## 2011-06-25 NOTE — Telephone Encounter (Signed)
rx re-faxed with written directions

## 2011-06-28 ENCOUNTER — Telehealth: Payer: Self-pay | Admitting: *Deleted

## 2011-06-28 NOTE — Telephone Encounter (Signed)
Please call re: Androgel

## 2011-06-28 NOTE — Telephone Encounter (Signed)
refaxed rx with directions to pharmacy

## 2011-07-04 ENCOUNTER — Telehealth: Payer: Self-pay | Admitting: *Deleted

## 2011-07-04 NOTE — Telephone Encounter (Signed)
Patient is calling back because Androgel is not longer covered and he would like to try Testim

## 2011-07-04 NOTE — Telephone Encounter (Signed)
Pt would like Fleet Contras to call re: testsosterone replacement.

## 2011-07-05 MED ORDER — TESTOSTERONE 50 MG/5GM (1%) TD GEL: 5.0000 g | Freq: Every day | TRANSDERMAL | Status: DC

## 2011-07-18 ENCOUNTER — Telehealth: Payer: Self-pay | Admitting: Family Medicine

## 2011-07-18 NOTE — Telephone Encounter (Signed)
Please advise - pt on LAB schedule for 9am

## 2011-07-18 NOTE — Telephone Encounter (Signed)
Patient called stating he would like to come in tomorrow to have his testosterone level checked, which has been ordered by Dr. Tawanna Cooler, however, the patient would also like to add a psa. Please assist as Dr. Tawanna Cooler is not in the office per advise by Debby.

## 2011-07-19 ENCOUNTER — Other Ambulatory Visit (INDEPENDENT_AMBULATORY_CARE_PROVIDER_SITE_OTHER): Payer: 59

## 2011-07-19 DIAGNOSIS — E291 Testicular hypofunction: Secondary | ICD-10-CM

## 2011-07-30 ENCOUNTER — Encounter: Payer: Self-pay | Admitting: Family Medicine

## 2011-07-30 ENCOUNTER — Ambulatory Visit (INDEPENDENT_AMBULATORY_CARE_PROVIDER_SITE_OTHER): Payer: 59 | Admitting: Family Medicine

## 2011-07-30 VITALS — BP 104/70 | Temp 97.6°F | Wt 190.0 lb

## 2011-07-30 DIAGNOSIS — E291 Testicular hypofunction: Secondary | ICD-10-CM

## 2011-07-30 MED ORDER — TESTOSTERONE 50 MG/5GM (1%) TD GEL: 5.0000 g | Freq: Every day | TRANSDERMAL | Status: DC

## 2011-07-30 NOTE — Patient Instructions (Signed)
Call the urology Center [phone] and asked to see one of the urologist this week for a consult and use my name as a referral source

## 2011-07-30 NOTE — Progress Notes (Signed)
  Subjective:    Patient ID: Jeremy Paul, male    DOB: [birth year], 46 y.o.   MRN: [id number]  HPI Jeremy Paul is a 46 year old male nonsmoker who comes in today for followup of low testosterone  We have tried him on medication however his testosterone level still remains low. Initially was 117 now to 171. He doesn't seem to be absorbing the steroid cream   We discussed various options. I think it would best for him to see a urologist at this juncture and consider the injections or other options   Review of Systems Gen. review of systems otherwise negative    Objective:   Physical Exam Well-developed well-nourished male in no acute distress       Assessment & Plan:  Low testosterone plan urology consult this week

## 2011-08-28 ENCOUNTER — Other Ambulatory Visit: Payer: Self-pay | Admitting: Neurology

## 2011-08-28 ENCOUNTER — Telehealth: Payer: Self-pay | Admitting: Neurology

## 2011-08-28 MED ORDER — INDOMETHACIN 25 MG PO CAPS: 25.0000 mg | ORAL_CAPSULE | Freq: Three times a day (TID) | ORAL | Status: AC | PRN

## 2011-08-28 NOTE — Telephone Encounter (Signed)
Spoke with Jeremy Paul. Aware med to be sent via e-script. Cautioned re: stomach upset and to take with food. No other issues voiced at this time.

## 2011-08-28 NOTE — Telephone Encounter (Signed)
Pt was seen in office on 04/05/2012 to "rule out a serious condition" but also talked about migraines with Dr. Modesto Charon. The possibility of medication (naproxin or indomethacin) was discussed, but the pt decided against it. Pt is now experiencing another headache and states that he may want to try medication now. Please call back at mobile number to discuss.

## 2011-08-28 NOTE — Telephone Encounter (Signed)
Called and spoke with the patient. States he has had a HA for a few days now that was not brought on by exercise. It is however exacerbated by exercise. Saw Dr. Modesto Charon in Dec 2012 for these exercise-induced HA and actually reports that he has not had a HA since then until now. He was hoping Dr. Modesto Charon could prescribe something to take on an as needed basis as the HA occur so infrequently. The pain is a 4/10 currently and he states they are not debilitating. Taking Aleve which only dulls the pain. I let him know that I would let Dr. Modesto Charon know about his situation and we would be in touch with any recommendations. The patient is ok with this plan. **Dr. Modesto Charon please advise medication for patient with sporadic headaches.

## 2011-08-28 NOTE — Telephone Encounter (Signed)
let's try indomethacin 25mg  caps, 1 cap tid prn headache.  warn him that it may irritate his stomach, so he should take it with food it possible.

## 2011-09-28 ENCOUNTER — Telehealth: Payer: Self-pay

## 2011-09-28 NOTE — Telephone Encounter (Signed)
Patient recently applied for a concealed weapon and would like to make sure Dr Tawanna Cooler Burnell Blanks is going to fill this out. Patient stated he understands there could be a fee and is okay with that.  Patient would only like a call back if there is an issue with this paperwork getting filled out.

## 2012-04-21 ENCOUNTER — Telehealth: Payer: Self-pay | Admitting: Family Medicine

## 2012-04-21 DIAGNOSIS — F411 Generalized anxiety disorder: Secondary | ICD-10-CM

## 2012-04-21 DIAGNOSIS — Z0000 Encounter for general adult medical examination without abnormal findings: Secondary | ICD-10-CM

## 2012-04-21 MED ORDER — LORAZEPAM 0.5 MG PO TABS: 5.0000 mg | ORAL_TABLET | Freq: Every day | ORAL | Status: DC

## 2012-04-21 MED ORDER — VALACYCLOVIR HCL 1 G PO TABS: 1000.0000 mg | ORAL_TABLET | Freq: Two times a day (BID) | ORAL | Status: DC

## 2012-04-21 NOTE — Telephone Encounter (Signed)
Pt needs refill of: valACYclovir 1000mg  LORazepam .5 Pls send to [facility]  Do not send to [facility].

## 2012-04-21 NOTE — Telephone Encounter (Signed)
Rx called in 

## 2012-07-15 ENCOUNTER — Other Ambulatory Visit: Payer: Self-pay | Admitting: Family Medicine

## 2012-08-21 ENCOUNTER — Ambulatory Visit (INDEPENDENT_AMBULATORY_CARE_PROVIDER_SITE_OTHER): Payer: 59 | Admitting: Family Medicine

## 2012-08-21 ENCOUNTER — Encounter: Payer: Self-pay | Admitting: Family Medicine

## 2012-08-21 ENCOUNTER — Telehealth: Payer: Self-pay | Admitting: Family Medicine

## 2012-08-21 VITALS — BP 130/84 | Temp 97.7°F | Wt 198.0 lb

## 2012-08-21 DIAGNOSIS — F411 Generalized anxiety disorder: Secondary | ICD-10-CM

## 2012-08-21 DIAGNOSIS — Z8619 Personal history of other infectious and parasitic diseases: Secondary | ICD-10-CM | POA: Insufficient documentation

## 2012-08-21 DIAGNOSIS — B009 Herpesviral infection, unspecified: Secondary | ICD-10-CM

## 2012-08-21 DIAGNOSIS — Z0000 Encounter for general adult medical examination without abnormal findings: Secondary | ICD-10-CM

## 2012-08-21 MED ORDER — PROPRANOLOL HCL 20 MG/5ML PO SOLN: ORAL | Status: DC

## 2012-08-21 MED ORDER — LORAZEPAM 1 MG PO TABS: ORAL_TABLET | ORAL | Status: DC

## 2012-08-21 MED ORDER — ACYCLOVIR 200 MG PO CAPS: ORAL_CAPSULE | ORAL | Status: DC

## 2012-08-21 NOTE — Telephone Encounter (Signed)
Pharm needs clarification on directions for propranolol (INDERAL) 20 MG/5ML solution. It came over as a liquid, but instructions say "take one tablet"  Pls advise.Marland Kitchen

## 2012-08-21 NOTE — Patient Instructions (Addendum)
Return sometime in the next 3 months for general physical exam  Labs one week prior  Inderal 20 mg,,,,,,,,,,,, one tablet one hour prior to speaking  Ativan 1 mg,,,,,,,,,, one tablet one hour prior to airplane trip  Acyclovir 200 mg,,,,,,,, one tablet daily for 3 months and then returned to year or when necessary Valtrex

## 2012-08-21 NOTE — Progress Notes (Signed)
  Subjective:    Patient ID: Jeremy Paul, male    DOB: 02/06/1966, 47 y.o.   MRN: [id number]  HPI Jeremy Paul is a 47 year old male who comes in today for evaluation of 3 issues  He does travel a lot in his job and he takes 8.5 mg Ativan tablet prior to flying but it doesn't seem to help. We discussed various options. I explained to him I am not a fan of Xanax. I would rather increase his Ativan  He also has a big speech coming up and Inderal might be of benefit prior to speaking  He also has recurrent HSV 1 they seem to be coming more frequently  He is also due for a physical exam   Review of Systems Review of systems otherwise negative    Objective:   Physical Exam  Well-developed well-nourished male no acute distress    Assessment & Plan:  Anxiety with flying increase Ativan to 1 mg when necessary  Anxiety related dyspnea but speaking Inderal one hour prior to speaking  Recurrent HSV 1 Valtrex 200 mg daily for 4 months  Return for CPX when the

## 2012-08-22 ENCOUNTER — Other Ambulatory Visit: Payer: Self-pay | Admitting: Family Medicine

## 2012-08-22 MED ORDER — PROPRANOLOL HCL 20 MG PO TABS: ORAL_TABLET | ORAL | Status: DC

## 2012-09-04 ENCOUNTER — Other Ambulatory Visit (INDEPENDENT_AMBULATORY_CARE_PROVIDER_SITE_OTHER): Payer: 59

## 2012-09-04 DIAGNOSIS — Z0000 Encounter for general adult medical examination without abnormal findings: Secondary | ICD-10-CM

## 2012-09-04 LAB — CBC WITH DIFFERENTIAL/PLATELET
Basophils Absolute: 0 10*3/uL (ref 0.0–0.1)
Eosinophils Absolute: 0.2 10*3/uL (ref 0.0–0.7)
HCT: 46.3 % (ref 39.0–52.0)
Hemoglobin: 16.1 g/dL (ref 13.0–17.0)
Lymphs Abs: 1.5 10*3/uL (ref 0.7–4.0)
MCHC: 34.8 g/dL (ref 30.0–36.0)
MCV: 90.5 fl (ref 78.0–100.0)
Monocytes Absolute: 0.6 10*3/uL (ref 0.1–1.0)
Neutro Abs: 3 10*3/uL (ref 1.4–7.7)
Platelets: 192 10*3/uL (ref 150.0–400.0)
RDW: 13.7 % (ref 11.5–14.6)

## 2012-09-04 LAB — BASIC METABOLIC PANEL
BUN: 16 mg/dL (ref 6–23)
Creatinine, Ser: 1.2 mg/dL (ref 0.4–1.5)
GFR: 70.09 mL/min (ref >60.00)
Glucose, Bld: 87 mg/dL (ref 70–99)
Potassium: 4.2 mEq/L (ref 3.5–5.1)

## 2012-09-04 LAB — POCT URINALYSIS DIPSTICK
Blood, UA: NEGATIVE
Leukocytes, UA: NEGATIVE
Nitrite, UA: NEGATIVE
Protein, UA: NEGATIVE
pH, UA: 7

## 2012-09-04 LAB — HEPATIC FUNCTION PANEL
AST: 24 U/L (ref 0–37)
Albumin: 4.2 g/dL (ref 3.5–5.2)
Alkaline Phosphatase: 44 U/L (ref 39–117)
Bilirubin, Direct: 0.1 mg/dL (ref 0.0–0.3)
Total Bilirubin: 0.9 mg/dL (ref 0.3–1.2)

## 2012-09-04 LAB — LIPID PANEL
Total CHOL/HDL Ratio: 4
Triglycerides: 158 mg/dL — ABNORMAL HIGH (ref 0.0–149.0)

## 2012-09-10 ENCOUNTER — Encounter: Payer: 59 | Admitting: Family Medicine

## 2012-09-16 ENCOUNTER — Other Ambulatory Visit: Payer: Self-pay | Admitting: *Deleted

## 2012-09-16 DIAGNOSIS — J309 Allergic rhinitis, unspecified: Secondary | ICD-10-CM

## 2012-09-16 MED ORDER — FLUTICASONE PROPIONATE 50 MCG/ACT NA SUSP: 1.0000 | Freq: Every day | NASAL | Status: DC

## 2012-11-11 ENCOUNTER — Encounter: Payer: Self-pay | Admitting: Family Medicine

## 2012-11-11 ENCOUNTER — Ambulatory Visit (INDEPENDENT_AMBULATORY_CARE_PROVIDER_SITE_OTHER): Payer: 59 | Admitting: Family Medicine

## 2012-11-11 VITALS — BP 130/86 | Temp 98.6°F | Ht 71.0 in | Wt 203.0 lb

## 2012-11-11 DIAGNOSIS — E785 Hyperlipidemia, unspecified: Secondary | ICD-10-CM

## 2012-11-11 DIAGNOSIS — Z8371 Family history of colonic polyps: Secondary | ICD-10-CM

## 2012-11-11 DIAGNOSIS — Z83719 Family history of colon polyps, unspecified: Secondary | ICD-10-CM

## 2012-11-11 DIAGNOSIS — E291 Testicular hypofunction: Secondary | ICD-10-CM

## 2012-11-11 DIAGNOSIS — F411 Generalized anxiety disorder: Secondary | ICD-10-CM

## 2012-11-11 DIAGNOSIS — J309 Allergic rhinitis, unspecified: Secondary | ICD-10-CM

## 2012-11-11 DIAGNOSIS — K200 Eosinophilic esophagitis: Secondary | ICD-10-CM

## 2012-11-11 DIAGNOSIS — B009 Herpesviral infection, unspecified: Secondary | ICD-10-CM

## 2012-11-11 DIAGNOSIS — K219 Gastro-esophageal reflux disease without esophagitis: Secondary | ICD-10-CM

## 2012-11-11 MED ORDER — SIMVASTATIN 40 MG PO TABS: ORAL_TABLET | ORAL | Status: DC

## 2012-11-11 MED ORDER — FLUTICASONE PROPIONATE 50 MCG/ACT NA SUSP: 1.0000 | Freq: Every day | NASAL | Status: DC

## 2012-11-11 NOTE — Progress Notes (Signed)
Subjective:    Patient ID: Jeremy Paul, male    DOB: 09-24-1965, 47 y.o.   MRN: [id number]  HPI Jeremy Paul is a 47 year old married male nonsmoker who comes in today for general physical examination  He's had a history of recurrent oral HSV 1. We recently placed him on acyclovir 200 mg daily and that has seemed to break the cycle.  He takes Ativan 1 mg flying ,,,,, and Inderal 20 mg prior to the speech. We gave him this a while back he tried it at work really well. He takes Zocor 40 mg daily along with an aspirin tablet for hyperlipidemia  He sees the urology Center Dr. Margarita Grizzle for followup of hypergonadism and hypo-testosterone is in. He's given a shot every month. He says he feels much better since he does not do testosterone he goes to the right she exercise and now he's developed some muscle mass is never had before.  Never had a routine eye exam recommend Dr. Vonna Kotyk, regular dental care, his brother and mother have both had polyps. We recommend he get a colonoscopy at this age. He is to turn his financial he says his $2000 to get it done here I will ask him to call [facility] to see if he can get it done they're cheaper  Tetanus booster 2010   Review of Systems  Constitutional: Negative.   HENT: Negative.   Eyes: Negative.   Respiratory: Negative.   Cardiovascular: Negative.   Gastrointestinal: Negative.   Genitourinary: Negative.   Musculoskeletal: Negative.   Skin: Negative.   Neurological: Negative.   Psychiatric/Behavioral: Negative.        Objective:   Physical Exam  Nursing note and vitals reviewed. Constitutional: He is oriented to person, place, and time. He appears well-developed and well-nourished.  HENT:  Head: Normocephalic and atraumatic.  Right Ear: External ear normal.  Left Ear: External ear normal.  Nose: Nose normal.  Mouth/Throat: Oropharynx is clear and moist.  Eyes: Conjunctivae and EOM are normal. Pupils are equal, round, and reactive to light.   Neck: Normal range of motion. Neck supple. No JVD present. No tracheal deviation present. No thyromegaly present.  Cardiovascular: Normal rate, regular rhythm, normal heart sounds and intact distal pulses.  Exam reveals no gallop and no friction rub.   No murmur heard. Pulmonary/Chest: Effort normal and breath sounds normal. No stridor. No respiratory distress. He has no wheezes. He has no rales. He exhibits no tenderness.  Abdominal: Soft. Bowel sounds are normal. He exhibits no distension and no mass. There is no tenderness. There is no rebound and no guarding.  Genitourinary:  Genitourinary exam done by urologist therefore not repeated  Musculoskeletal: Normal range of motion. He exhibits no edema and no tenderness.  Lymphadenopathy:    He has no cervical adenopathy.  Neurological: He is alert and oriented to person, place, and time. He has normal reflexes. No cranial nerve deficit. He exhibits normal muscle tone.  Skin: Skin is warm and dry. No rash noted. No erythema. No pallor.  Total body skin exam normal  Psychiatric: He has a normal mood and affect. His behavior is normal. Judgment and thought content normal.          Assessment & Plan:  Healthy male  History of HSV 1 acyclovir 200 mg daily for 6 months  Allergic rhinitis continue Flonase when necessary  Occasional anxiety with flying Ativan 1 mg prior to flying  History of anxiety related to speeches continue Inderal 20 when necessary  Hyperlipidemia continue Zocor 40 mg daily  T followup in urology  History of colon polyps mom and brother recommend he get a colonoscopy now. His limiting factor is cost. I asked him to call [facility] and see if that might be cheaper

## 2012-11-11 NOTE — Patient Instructions (Addendum)
Continue current medicines and get health habits  Return in one year sooner if any problems  Call [facility] to see if you can get a colonoscopy cheaper there  Return in one year sooner if any problems

## 2013-05-18 ENCOUNTER — Other Ambulatory Visit: Payer: Self-pay | Admitting: Family Medicine

## 2013-07-28 ENCOUNTER — Telehealth: Payer: Self-pay | Admitting: Family Medicine

## 2013-07-28 DIAGNOSIS — E785 Hyperlipidemia, unspecified: Secondary | ICD-10-CM

## 2013-07-28 NOTE — Telephone Encounter (Signed)
[facility] [place] [address] is requesting a re-fill on the following: simvastatin (ZOCOR) 40 MG tablet acyclovir (ZOVIRAX) 200 MG capsule

## 2013-07-29 MED ORDER — SIMVASTATIN 40 MG PO TABS: ORAL_TABLET | ORAL | Status: DC

## 2013-07-29 MED ORDER — ACYCLOVIR 200 MG PO CAPS: 200.0000 mg | ORAL_CAPSULE | Freq: Every day | ORAL | Status: DC

## 2013-08-30 ENCOUNTER — Other Ambulatory Visit: Payer: Self-pay | Admitting: Family Medicine

## 2013-09-30 ENCOUNTER — Other Ambulatory Visit: Payer: Self-pay | Admitting: Family Medicine

## 2013-10-12 ENCOUNTER — Telehealth: Payer: Self-pay | Admitting: Family Medicine

## 2013-10-12 MED ORDER — PROPRANOLOL HCL 20 MG PO TABS: ORAL_TABLET | ORAL | Status: DC

## 2013-10-12 NOTE — Telephone Encounter (Signed)
[facility] [id number] - [place], [place] - [address] us requesting 90 day re-fill on propranolol (INDERAL) 20 MG tablet

## 2013-11-22 ENCOUNTER — Other Ambulatory Visit: Payer: Self-pay | Admitting: Family Medicine

## 2014-02-18 ENCOUNTER — Other Ambulatory Visit: Payer: Self-pay | Admitting: Family Medicine

## 2014-03-03 ENCOUNTER — Other Ambulatory Visit: Payer: Self-pay | Admitting: Family Medicine

## 2014-04-21 ENCOUNTER — Other Ambulatory Visit: Payer: Self-pay | Admitting: Family Medicine

## 2014-04-27 ENCOUNTER — Telehealth: Payer: Self-pay | Admitting: Family Medicine

## 2014-04-27 NOTE — Telephone Encounter (Signed)
Pt states he was attempting to place order for refill of acyclovir (ZOVIRAX) 200 MG capsule online with [facility] and was notified that he needed to contact his physician.  Please call patient back regarding refill of this medication.

## 2014-04-29 NOTE — Telephone Encounter (Signed)
Left message on machine for patient to schedule an appointment for more refills.

## 2014-05-04 ENCOUNTER — Other Ambulatory Visit: Payer: Self-pay | Admitting: Family Medicine

## 2014-05-04 DIAGNOSIS — F411 Generalized anxiety disorder: Secondary | ICD-10-CM

## 2014-05-04 MED ORDER — ACYCLOVIR 200 MG PO CAPS: 200.0000 mg | ORAL_CAPSULE | Freq: Every day | ORAL | Status: DC

## 2014-05-05 MED ORDER — LORAZEPAM 1 MG PO TABS: ORAL_TABLET | ORAL | Status: DC

## 2014-05-05 NOTE — Telephone Encounter (Signed)
rx sent

## 2014-05-05 NOTE — Addendum Note (Signed)
Addended by: Kern ReapVEREEN, Etty Isaac B on: 05/05/2014 02:29 PM   Modules accepted: Orders

## 2014-05-17 ENCOUNTER — Other Ambulatory Visit (INDEPENDENT_AMBULATORY_CARE_PROVIDER_SITE_OTHER): Payer: 59 | Admitting: *Deleted

## 2014-05-17 DIAGNOSIS — Z0000 Encounter for general adult medical examination without abnormal findings: Secondary | ICD-10-CM

## 2014-05-18 ENCOUNTER — Other Ambulatory Visit (INDEPENDENT_AMBULATORY_CARE_PROVIDER_SITE_OTHER): Payer: 59

## 2014-05-18 DIAGNOSIS — Z0000 Encounter for general adult medical examination without abnormal findings: Secondary | ICD-10-CM

## 2014-05-18 LAB — CBC WITH DIFFERENTIAL/PLATELET
BASOS ABS: 0 10*3/uL (ref 0.0–0.1)
BASOS PCT: 0.5 % (ref 0.0–3.0)
Eosinophils Absolute: 0.2 10*3/uL (ref 0.0–0.7)
Eosinophils Relative: 4.5 % (ref 0.0–5.0)
HEMATOCRIT: 47.8 % (ref 39.0–52.0)
HEMOGLOBIN: 15.7 g/dL (ref 13.0–17.0)
LYMPHS ABS: 1.4 10*3/uL (ref 0.7–4.0)
LYMPHS PCT: 31.6 % (ref 12.0–46.0)
MCHC: 32.9 g/dL (ref 30.0–36.0)
MCV: 94 fl (ref 78.0–100.0)
Monocytes Absolute: 0.5 10*3/uL (ref 0.1–1.0)
Monocytes Relative: 11 % (ref 3.0–12.0)
NEUTROS ABS: 2.3 10*3/uL (ref 1.4–7.7)
Neutrophils Relative %: 52.4 % (ref 43.0–77.0)
PLATELETS: 172 10*3/uL (ref 150.0–400.0)
RBC: 5.09 Mil/uL (ref 4.22–5.81)
RDW: 13.5 % (ref 11.5–15.5)
WBC: 4.4 10*3/uL (ref 4.0–10.5)

## 2014-05-18 LAB — POCT URINALYSIS DIPSTICK
Bilirubin, UA: NEGATIVE
Blood, UA: NEGATIVE
GLUCOSE UA: NEGATIVE
Ketones, UA: NEGATIVE
Leukocytes, UA: NEGATIVE
Nitrite, UA: NEGATIVE
Spec Grav, UA: 1.02
Urobilinogen, UA: 0.2
pH, UA: 7.5

## 2014-05-18 LAB — LIPID PANEL
Cholesterol: 152 mg/dL (ref 0–200)
HDL: 47 mg/dL (ref >39.00)
LDL CALC: 81 mg/dL (ref 0–99)
NONHDL: 105
TRIGLYCERIDES: 118 mg/dL (ref 0.0–149.0)
Total CHOL/HDL Ratio: 3
VLDL: 23.6 mg/dL (ref 0.0–40.0)

## 2014-05-18 LAB — COMPREHENSIVE METABOLIC PANEL
ALK PHOS: 43 U/L (ref 39–117)
ALT: 37 U/L (ref 0–53)
AST: 37 U/L (ref 0–37)
Albumin: 4.4 g/dL (ref 3.5–5.2)
BUN: 21 mg/dL (ref 6–23)
CO2: 31 mEq/L (ref 19–32)
Calcium: 9.5 mg/dL (ref 8.4–10.5)
Chloride: 101 mEq/L (ref 96–112)
Creatinine, Ser: 1.29 mg/dL (ref 0.40–1.50)
GFR: 63.38 mL/min (ref >60.00)
GLUCOSE: 95 mg/dL (ref 70–99)
Potassium: 4.3 mEq/L (ref 3.5–5.1)
Sodium: 136 mEq/L (ref 135–145)
Total Bilirubin: 0.5 mg/dL (ref 0.2–1.2)
Total Protein: 7.2 g/dL (ref 6.0–8.3)

## 2014-05-18 LAB — TSH: TSH: 2.79 u[IU]/mL (ref 0.35–4.50)

## 2014-05-19 ENCOUNTER — Other Ambulatory Visit: Payer: Self-pay | Admitting: Family Medicine

## 2014-05-24 ENCOUNTER — Encounter: Payer: Self-pay | Admitting: Family Medicine

## 2014-05-24 ENCOUNTER — Ambulatory Visit (INDEPENDENT_AMBULATORY_CARE_PROVIDER_SITE_OTHER): Payer: 59 | Admitting: Family Medicine

## 2014-05-24 VITALS — BP 140/90 | Temp 98.3°F | Ht 71.0 in | Wt 201.0 lb

## 2014-05-24 DIAGNOSIS — K219 Gastro-esophageal reflux disease without esophagitis: Secondary | ICD-10-CM

## 2014-05-24 DIAGNOSIS — E785 Hyperlipidemia, unspecified: Secondary | ICD-10-CM

## 2014-05-24 DIAGNOSIS — Z23 Encounter for immunization: Secondary | ICD-10-CM

## 2014-05-24 DIAGNOSIS — F411 Generalized anxiety disorder: Secondary | ICD-10-CM

## 2014-05-24 DIAGNOSIS — J301 Allergic rhinitis due to pollen: Secondary | ICD-10-CM

## 2014-05-24 DIAGNOSIS — B009 Herpesviral infection, unspecified: Secondary | ICD-10-CM

## 2014-05-24 DIAGNOSIS — Z0000 Encounter for general adult medical examination without abnormal findings: Secondary | ICD-10-CM

## 2014-05-24 MED ORDER — SIMVASTATIN 40 MG PO TABS: 40.0000 mg | ORAL_TABLET | Freq: Every day | ORAL | Status: DC

## 2014-05-24 MED ORDER — FLUTICASONE PROPIONATE 50 MCG/ACT NA SUSP: 1.0000 | Freq: Every day | NASAL | Status: DC

## 2014-05-24 MED ORDER — ACYCLOVIR 200 MG PO CAPS: ORAL_CAPSULE | ORAL | Status: DC

## 2014-05-24 MED ORDER — PROPRANOLOL HCL 20 MG PO TABS: ORAL_TABLET | ORAL | Status: DC

## 2014-05-24 MED ORDER — ESOMEPRAZOLE MAGNESIUM 40 MG PO CPDR: 40.0000 mg | DELAYED_RELEASE_CAPSULE | Freq: Every day | ORAL | Status: DC

## 2014-05-24 NOTE — Progress Notes (Signed)
   Subjective:    Patient ID: Jeremy Paul Paul, male    DOB: 21-Apr-1966, 49 y.o.   MRN: [id number]  HPI [name] is a 49 year old male who comes in today for general physical examination  He has a history of HSV-1 and takes acyclovir 200 mg daily  He has chronic reflux esophagitis for which he takes Nexium 40 mg daily  He has allergic rhinitis he takes Zyrtec 10 mg plain a bedtime steroid nasal spray when necessary  He takes Zocor daily for hyperlipidemia 40 mg lipids are at goal  He also takes Inderal 20 mg prior to speaking and Ativan 1 mg prior to flying.  He gets routine eye care dental care colonoscopy at age 49. His maternal or paternal grandparent had colon polyps but no bleeding his immediate family  Vaccinations updated by Fleet Contrasachel   Review of Systems  Constitutional: Negative.   HENT: Negative.   Eyes: Negative.   Respiratory: Negative.   Cardiovascular: Negative.   Gastrointestinal: Negative.   Endocrine: Negative.   Genitourinary: Negative.   Musculoskeletal: Negative.   Skin: Negative.   Allergic/Immunologic: Negative.   Neurological: Negative.   Hematological: Negative.   Psychiatric/Behavioral: Negative.        Objective:   Physical Exam  Constitutional: He is oriented to person, place, and time. He appears well-developed and well-nourished.  HENT:  Head: Normocephalic and atraumatic.  Right Ear: External ear normal.  Left Ear: External ear normal.  Nose: Nose normal.  Mouth/Throat: Oropharynx is clear and moist.  Eyes: Conjunctivae and EOM are normal. Pupils are equal, round, and reactive to light.  Neck: Normal range of motion. Neck supple. No JVD present. No tracheal deviation present. No thyromegaly present.  Cardiovascular: Normal rate, regular rhythm, normal heart sounds and intact distal pulses.  Exam reveals no gallop and no friction rub.   No murmur heard. Pulmonary/Chest: Effort normal and breath sounds normal. No stridor. No respiratory distress. He  has no wheezes. He has no rales. He exhibits no tenderness.  Abdominal: Soft. Bowel sounds are normal. He exhibits no distension and no mass. There is no tenderness. There is no rebound and no guarding.  Genitourinary:  Genitourinary exam done by urologist. He sees urologist every 6 months because she's on a testosterone supplement  Musculoskeletal: Normal range of motion. He exhibits no edema or tenderness.  Lymphadenopathy:    He has no cervical adenopathy.  Neurological: He is alert and oriented to person, place, and time. He has normal reflexes. No cranial nerve deficit. He exhibits normal muscle tone.  Skin: Skin is warm and dry. No rash noted. No erythema. No pallor.  Psychiatric: He has a normal mood and affect. His behavior is normal. Judgment and thought content normal.  Nursing note and vitals reviewed.         Assessment & Plan:  Healthy male  History of HSV-1 continue acyclovir 200 mg daily for prophylaxis  Reflux esophagitis continue Nexium  Allergic rhinitis continue Zyrtec and steroid nasal spray  Adult than prior to flying and Inderal prior to speaking  Zocor 40 mg daily for hyperlipidemia  T on testosterone supplement followed by urology

## 2014-05-24 NOTE — Progress Notes (Signed)
Pre visit review using our clinic review tool, if applicable. No additional management support is needed unless otherwise documented below in the visit note. 

## 2014-05-24 NOTE — Patient Instructions (Signed)
Continue current medications  Follow-up in one year sooner if any problem 

## 2014-08-17 ENCOUNTER — Other Ambulatory Visit: Payer: Self-pay | Admitting: Family Medicine

## 2014-11-15 ENCOUNTER — Other Ambulatory Visit: Payer: Self-pay | Admitting: Family Medicine

## 2015-01-02 ENCOUNTER — Other Ambulatory Visit: Payer: Self-pay | Admitting: Family Medicine

## 2015-05-18 ENCOUNTER — Other Ambulatory Visit: Payer: Self-pay | Admitting: Family Medicine

## 2015-06-06 ENCOUNTER — Telehealth: Payer: Self-pay | Admitting: Family Medicine

## 2015-06-06 NOTE — Telephone Encounter (Signed)
Pt son was dx with flu and prescribed tamiflu. Pt dad would like tamiflu call into [facility]. Pt does not have any symptoms

## 2015-06-06 NOTE — Telephone Encounter (Signed)
Spoke with patient and explained that Dr Tawanna Cooler does not prescribe Tamiflu.

## 2015-06-20 ENCOUNTER — Other Ambulatory Visit: Payer: Self-pay | Admitting: Family Medicine

## 2015-08-24 ENCOUNTER — Encounter: Payer: Self-pay | Admitting: Gastroenterology

## 2015-09-03 ENCOUNTER — Other Ambulatory Visit: Payer: Self-pay | Admitting: Family Medicine

## 2015-09-21 ENCOUNTER — Other Ambulatory Visit: Payer: Self-pay | Admitting: Family Medicine

## 2015-11-24 ENCOUNTER — Other Ambulatory Visit: Payer: Self-pay | Admitting: Family Medicine

## 2016-01-02 ENCOUNTER — Other Ambulatory Visit: Payer: Self-pay | Admitting: Family Medicine

## 2016-02-13 ENCOUNTER — Other Ambulatory Visit: Payer: Self-pay | Admitting: Family Medicine

## 2016-03-20 ENCOUNTER — Other Ambulatory Visit: Payer: Self-pay

## 2016-03-20 NOTE — Telephone Encounter (Signed)
Pt not seen for this problem since 04/2014. Refill denied.

## 2016-05-28 ENCOUNTER — Other Ambulatory Visit: Payer: Self-pay | Admitting: Emergency Medicine

## 2016-05-28 MED ORDER — VALACYCLOVIR HCL 1 G PO TABS: 1000.0000 mg | ORAL_TABLET | Freq: Two times a day (BID) | ORAL | 3 refills | Status: DC

## 2016-12-10 ENCOUNTER — Telehealth: Payer: Self-pay | Admitting: Family Medicine

## 2016-12-10 NOTE — Telephone Encounter (Signed)
Pt request refill  propranolol (INDERAL) 20 MG tablet  Pt states he does not use this frequently but it helps with this helps public speaking  Pt states he has to speak on Friday.  [facility] [phone] - [place] - [address] [facility]

## 2016-12-11 ENCOUNTER — Other Ambulatory Visit: Payer: Self-pay

## 2016-12-13 ENCOUNTER — Other Ambulatory Visit: Payer: Self-pay

## 2016-12-13 MED ORDER — PROPRANOLOL HCL 20 MG PO TABS: ORAL_TABLET | ORAL | 0 refills | Status: DC

## 2016-12-13 NOTE — Telephone Encounter (Signed)
Patient sees Dr Tawanna Coolerodd and  is requesting for a refill on Propranolol (INDERAL) 20 MG which was last filled on 01/04/2015 wit #[phone], patient has A CPE appointment scheduled on 01/23/2017 at 8.30. Please Advise if ok to refill this medication.

## 2016-12-13 NOTE — Telephone Encounter (Signed)
Rx has been E scribed to pt pharmacy #[id number] with no refills

## 2016-12-13 NOTE — Telephone Encounter (Signed)
Call in #90 with no rf 

## 2017-01-10 ENCOUNTER — Other Ambulatory Visit: Payer: Self-pay | Admitting: Family Medicine

## 2017-01-18 ENCOUNTER — Encounter: Payer: Self-pay | Admitting: Family Medicine

## 2017-01-23 ENCOUNTER — Encounter: Payer: Self-pay | Admitting: Family Medicine

## 2017-01-23 ENCOUNTER — Ambulatory Visit (INDEPENDENT_AMBULATORY_CARE_PROVIDER_SITE_OTHER): Payer: 59 | Admitting: Family Medicine

## 2017-01-23 VITALS — BP 108/80 | HR 68 | Temp 98.4°F | Ht 71.0 in | Wt 197.0 lb

## 2017-01-23 DIAGNOSIS — J301 Allergic rhinitis due to pollen: Secondary | ICD-10-CM | POA: Diagnosis not present

## 2017-01-23 DIAGNOSIS — K219 Gastro-esophageal reflux disease without esophagitis: Secondary | ICD-10-CM

## 2017-01-23 DIAGNOSIS — Z0000 Encounter for general adult medical examination without abnormal findings: Secondary | ICD-10-CM | POA: Diagnosis not present

## 2017-01-23 DIAGNOSIS — E7800 Pure hypercholesterolemia, unspecified: Secondary | ICD-10-CM

## 2017-01-23 DIAGNOSIS — Z23 Encounter for immunization: Secondary | ICD-10-CM

## 2017-01-23 LAB — LIPID PANEL
Cholesterol: 175 mg/dL (ref 0–200)
HDL: 52.4 mg/dL (ref >39.00)
LDL Cholesterol: 103 mg/dL — ABNORMAL HIGH (ref 0–99)
NONHDL: 122.99
TRIGLYCERIDES: 98 mg/dL (ref 0.0–149.0)
Total CHOL/HDL Ratio: 3
VLDL: 19.6 mg/dL (ref 0.0–40.0)

## 2017-01-23 LAB — HEPATIC FUNCTION PANEL
ALK PHOS: 41 U/L (ref 39–117)
ALT: 37 U/L (ref 0–53)
AST: 26 U/L (ref 0–37)
Albumin: 4.4 g/dL (ref 3.5–5.2)
Bilirubin, Direct: 0.1 mg/dL (ref 0.0–0.3)
Total Bilirubin: 0.7 mg/dL (ref 0.2–1.2)
Total Protein: 6.8 g/dL (ref 6.0–8.3)

## 2017-01-23 LAB — CBC WITH DIFFERENTIAL/PLATELET
BASOS ABS: 0 10*3/uL (ref 0.0–0.1)
Basophils Relative: 0.4 % (ref 0.0–3.0)
EOS ABS: 0.2 10*3/uL (ref 0.0–0.7)
Eosinophils Relative: 4.5 % (ref 0.0–5.0)
HEMATOCRIT: 47.3 % (ref 39.0–52.0)
Hemoglobin: 15.9 g/dL (ref 13.0–17.0)
LYMPHS PCT: 24.6 % (ref 12.0–46.0)
Lymphs Abs: 1 10*3/uL (ref 0.7–4.0)
MCHC: 33.6 g/dL (ref 30.0–36.0)
MCV: 94.5 fl (ref 78.0–100.0)
MONO ABS: 0.5 10*3/uL (ref 0.1–1.0)
Monocytes Relative: 11.9 % (ref 3.0–12.0)
Neutro Abs: 2.3 10*3/uL (ref 1.4–7.7)
Neutrophils Relative %: 58.6 % (ref 43.0–77.0)
PLATELETS: 170 10*3/uL (ref 150.0–400.0)
RBC: 5.01 Mil/uL (ref 4.22–5.81)
RDW: 12.9 % (ref 11.5–15.5)
WBC: 4 10*3/uL (ref 4.0–10.5)

## 2017-01-23 LAB — BASIC METABOLIC PANEL
BUN: 16 mg/dL (ref 6–23)
CALCIUM: 9.6 mg/dL (ref 8.4–10.5)
CO2: 32 mEq/L (ref 19–32)
Chloride: 102 mEq/L (ref 96–112)
Creatinine, Ser: 1.12 mg/dL (ref 0.40–1.50)
GFR: 73.78 mL/min (ref >60.00)
GLUCOSE: 94 mg/dL (ref 70–99)
POTASSIUM: 4.6 meq/L (ref 3.5–5.1)
SODIUM: 140 meq/L (ref 135–145)

## 2017-01-23 LAB — PSA: PSA: 0.66 ng/mL (ref 0.10–4.00)

## 2017-01-23 LAB — POCT URINALYSIS DIPSTICK
Bilirubin, UA: NEGATIVE
Clarity, UA: NEGATIVE
GLUCOSE UA: NEGATIVE
Ketones, UA: NEGATIVE
Leukocytes, UA: NEGATIVE
NITRITE UA: NEGATIVE
PH UA: 8.5 — AB (ref 5.0–8.0)
RBC UA: NEGATIVE
Spec Grav, UA: 1.015 (ref 1.010–1.025)
UROBILINOGEN UA: 0.2 U/dL

## 2017-01-23 MED ORDER — LORAZEPAM 1 MG PO TABS: ORAL_TABLET | ORAL | 1 refills | Status: DC

## 2017-01-23 MED ORDER — SIMVASTATIN 40 MG PO TABS: 40.0000 mg | ORAL_TABLET | Freq: Every day | ORAL | 4 refills | Status: DC

## 2017-01-23 MED ORDER — PROPRANOLOL HCL 20 MG PO TABS: ORAL_TABLET | ORAL | 0 refills | Status: DC

## 2017-01-23 MED ORDER — VALACYCLOVIR HCL 1 G PO TABS: 1000.0000 mg | ORAL_TABLET | Freq: Two times a day (BID) | ORAL | 3 refills | Status: DC

## 2017-01-23 MED ORDER — FLUTICASONE PROPIONATE 50 MCG/ACT NA SUSP: NASAL | 11 refills | Status: DC

## 2017-01-23 MED ORDER — ACYCLOVIR 200 MG PO CAPS: 200.0000 mg | ORAL_CAPSULE | Freq: Every day | ORAL | 4 refills | Status: DC

## 2017-01-23 NOTE — Progress Notes (Signed)
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 He doesn't take an aspirin tablet with it because he's had a history of GI upset at one time had a bleed. This was approximately 10 years ago. He had a colonoscopy which was normal.  He takes Inderal one hour prior to speaking for stage fright  He also has a fear of flying. He therefore takes one Ativan before he flies  He also uses Flonase for allergic rhinitis OTC Nexium because of chronic reflux esophagitis.  He's on an injectable testosterone by his urologist. He sees him every 6 months.  He gets routine eye care, dental care, colonoscopy 10 years ago was normal. He's due for follow-up colonoscopy.  Vaccinations tetanus booster 2010 seasonal flu shot given today  Social history........ he's married lives here in [place] works for [employer]. He exercises 3 times a week weightlifting,

## 2017-01-23 NOTE — Patient Instructions (Signed)
Continue good diet exercise and health habits  Follow-up in one year sooner if any problems

## 2017-02-22 ENCOUNTER — Encounter: Payer: Self-pay | Admitting: Family Medicine

## 2017-03-31 ENCOUNTER — Other Ambulatory Visit: Payer: Self-pay | Admitting: Family Medicine

## 2017-04-15 ENCOUNTER — Other Ambulatory Visit: Payer: Self-pay | Admitting: Family Medicine

## 2018-05-20 ENCOUNTER — Encounter: Payer: Self-pay | Admitting: Family Medicine

## 2018-05-20 ENCOUNTER — Ambulatory Visit (INDEPENDENT_AMBULATORY_CARE_PROVIDER_SITE_OTHER): Payer: 59 | Admitting: Family Medicine

## 2018-05-20 VITALS — BP 134/84 | HR 65 | Temp 98.6°F | Ht 71.0 in | Wt 208.6 lb

## 2018-05-20 DIAGNOSIS — K2100 Gastro-esophageal reflux disease with esophagitis, without bleeding: Secondary | ICD-10-CM

## 2018-05-20 DIAGNOSIS — J301 Allergic rhinitis due to pollen: Secondary | ICD-10-CM

## 2018-05-20 DIAGNOSIS — E291 Testicular hypofunction: Secondary | ICD-10-CM

## 2018-05-20 DIAGNOSIS — H9319 Tinnitus, unspecified ear: Secondary | ICD-10-CM | POA: Insufficient documentation

## 2018-05-20 DIAGNOSIS — H9311 Tinnitus, right ear: Secondary | ICD-10-CM

## 2018-05-20 DIAGNOSIS — Z8619 Personal history of other infectious and parasitic diseases: Secondary | ICD-10-CM | POA: Diagnosis not present

## 2018-05-20 DIAGNOSIS — F40298 Other specified phobia: Secondary | ICD-10-CM | POA: Diagnosis not present

## 2018-05-20 DIAGNOSIS — E7800 Pure hypercholesterolemia, unspecified: Secondary | ICD-10-CM

## 2018-05-20 MED ORDER — SIMVASTATIN 40 MG PO TABS: 40.0000 mg | ORAL_TABLET | Freq: Every day | ORAL | 3 refills | Status: DC

## 2018-05-20 MED ORDER — ACYCLOVIR 200 MG PO CAPS: 200.0000 mg | ORAL_CAPSULE | Freq: Every day | ORAL | 3 refills | Status: DC

## 2018-05-20 MED ORDER — PROPRANOLOL HCL 20 MG PO TABS: ORAL_TABLET | ORAL | 0 refills | Status: DC

## 2018-05-20 MED ORDER — PROPRANOLOL HCL 20 MG PO TABS: ORAL_TABLET | ORAL | 1 refills | Status: DC

## 2018-05-20 MED ORDER — LORAZEPAM 1 MG PO TABS: ORAL_TABLET | ORAL | 1 refills | Status: DC

## 2018-05-20 MED ORDER — VALACYCLOVIR HCL 1 G PO TABS: 2000.0000 mg | ORAL_TABLET | Freq: Two times a day (BID) | ORAL | 0 refills | Status: AC | PRN

## 2018-05-20 MED ORDER — VALACYCLOVIR HCL 1 G PO TABS: 2000.0000 mg | ORAL_TABLET | Freq: Two times a day (BID) | ORAL | 0 refills | Status: DC | PRN

## 2018-05-20 NOTE — Patient Instructions (Signed)
It was very nice to see you today!  I will send in your medications.  No other changes today.  [facility]  Please come back to see me over the summer for your physical with blood work, or sooner as needed.  Take care, Dr [name]

## 2018-05-20 NOTE — Assessment & Plan Note (Signed)
No red flags.  Likely secondary to loud noise exposure.  He will be [facility] with [facility] for further [facility] and evaluation.

## 2018-05-20 NOTE — Assessment & Plan Note (Signed)
Stable.  Continue over-the-counter Flonase as needed.

## 2018-05-20 NOTE — Assessment & Plan Note (Signed)
Last LDL 103.  Continue simvastatin 40 mg daily.

## 2018-05-20 NOTE — Assessment & Plan Note (Signed)
Stable.  Continue testosterone per urology.

## 2018-05-20 NOTE — Progress Notes (Signed)
   Subjective:  Jeremy Paul is a 53 y.o. male who presents today with a chief complaint of tinnitus and to transfer care to this office.   HPI:  Tinnitus Months ago.  Have been intermittent since then.  No associated hearing loss or dizziness.  No weakness or numbness.  No vision changes.  Patient was previously in the [employer] and was involved in the golf war.  Has a significant history of loud noise exposure.  His stable, chronic medical conditions are outlined below:  # Specific Phobias -Takes Ativan as needed prior to plane rides.  This helps control symptoms.  No reported side effects. -Takes propranolol 20 mg as needed prior to public speaking.  This helps control symptoms.  No reported side effects.  # HLD -Currently on simvastatin 40 mg daily and tolerating well. -No myalgias.  # History of cold sores - Currently on acyclovir 200mg  daily for suppressive therapy.  Tolerating well without side effects. - Takes valtrex 2000 mg for flareups. - No recent flares  # GERD with esophageal stricture - Currently on nexium 20mg  daily and tolerating well without side effects.  # Allergic Rhinitis -Uses Flonase nasal spray as needed.  % Low Testosterone - Follows with urology - Currently takes testosterone cypionate 200mg  weekly  ROS: Per HPI  PMH: He reports that he has never smoked. He has never used smokeless tobacco. He reports current alcohol use. He reports that he does not use drugs.  Objective:  Physical Exam: BP 134/84 (BP Location: Left Arm, Patient Position: Sitting, Cuff Size: Normal)   Pulse 65   Temp 98.6 F (37 C) (Oral)   Ht 5\' 11"  (1.803 m)   Wt 208 lb 9.6 oz (94.6 kg)   SpO2 96%   BMI 29.09 kg/m   Gen: NAD, resting comfortably HEENT: TMs clear bilaterally. CV: RRR with no murmurs appreciated Pulm: NWOB, CTAB with no crackles, wheezes, or rhonchi  Assessment/Plan:  Tinnitus No red flags.  Likely secondary to loud noise exposure.  [facility] and [facility].  Specific phobia We will refill his Ativan for flights and his propranolol for public speaking.  Hyperlipidemia Last LDL 103.  Continue simvastatin 40 mg daily.  Hypogonadism, male Stable.  Continue testosterone per urology.  H/O cold sores We will refill suppressive therapy with acyclovir 200 mg daily.  Will refill Valtrex 2000 mg twice daily as needed for outbreaks.  GERD Stable.  Continue Nexium 20 mg daily.  Allergic rhinitis Stable.  Continue over-the-counter Flonase as needed.  Katina Degree. [name], MD 05/20/2018 4:38 PM

## 2018-05-20 NOTE — Assessment & Plan Note (Signed)
Stable.  Continue Nexium  20mg daily

## 2018-05-20 NOTE — Assessment & Plan Note (Signed)
We will refill suppressive therapy with acyclovir 200 mg daily.  Will refill Valtrex 2000 mg twice daily as needed for outbreaks.

## 2018-05-20 NOTE — Assessment & Plan Note (Signed)
We will refill his Ativan for flights and his propranolol for public speaking.

## 2018-05-26 ENCOUNTER — Other Ambulatory Visit: Payer: Self-pay

## 2018-05-26 MED ORDER — PROPRANOLOL HCL 20 MG PO TABS: ORAL_TABLET | ORAL | 1 refills | Status: DC

## 2018-05-27 ENCOUNTER — Other Ambulatory Visit: Payer: Self-pay

## 2018-05-27 ENCOUNTER — Telehealth: Payer: Self-pay | Admitting: Family Medicine

## 2018-05-27 MED ORDER — PROPRANOLOL HCL 20 MG PO TABS: ORAL_TABLET | ORAL | 1 refills | Status: DC

## 2018-05-27 NOTE — Telephone Encounter (Signed)
Rx sent to pharmacy   

## 2018-05-27 NOTE — Telephone Encounter (Signed)
Copied from CRM [id number]. Topic: General - Other >> May 27, 2018  1:41 PM Percival Spanish wrote:  Pharmacy call and would like a call back about what the maxium daily dose pt can take per day she said she has to have this in order to fill the rx  propranolol (INDERAL) 20 MG tablet    Pharmacy [facility]    [phone] ref number  [phone]

## 2018-08-02 ENCOUNTER — Other Ambulatory Visit: Payer: Self-pay | Admitting: Family Medicine

## 2018-08-04 NOTE — Telephone Encounter (Signed)
Rx Request 

## 2018-11-19 ENCOUNTER — Encounter: Payer: 59 | Admitting: Family Medicine

## 2018-12-09 ENCOUNTER — Other Ambulatory Visit: Payer: Self-pay

## 2018-12-09 ENCOUNTER — Encounter: Payer: Self-pay | Admitting: Family Medicine

## 2018-12-09 ENCOUNTER — Ambulatory Visit (INDEPENDENT_AMBULATORY_CARE_PROVIDER_SITE_OTHER): Payer: 59 | Admitting: Family Medicine

## 2018-12-09 VITALS — BP 110/77 | HR 63 | Temp 97.3°F | Ht 71.0 in | Wt 205.4 lb

## 2018-12-09 DIAGNOSIS — Z23 Encounter for immunization: Secondary | ICD-10-CM

## 2018-12-09 DIAGNOSIS — K2100 Gastro-esophageal reflux disease with esophagitis, without bleeding: Secondary | ICD-10-CM

## 2018-12-09 DIAGNOSIS — Z8619 Personal history of other infectious and parasitic diseases: Secondary | ICD-10-CM | POA: Diagnosis not present

## 2018-12-09 DIAGNOSIS — Z0001 Encounter for general adult medical examination with abnormal findings: Secondary | ICD-10-CM | POA: Diagnosis not present

## 2018-12-09 DIAGNOSIS — E7800 Pure hypercholesterolemia, unspecified: Secondary | ICD-10-CM | POA: Diagnosis not present

## 2018-12-09 DIAGNOSIS — R739 Hyperglycemia, unspecified: Secondary | ICD-10-CM | POA: Insufficient documentation

## 2018-12-09 DIAGNOSIS — J301 Allergic rhinitis due to pollen: Secondary | ICD-10-CM

## 2018-12-09 DIAGNOSIS — E291 Testicular hypofunction: Secondary | ICD-10-CM | POA: Diagnosis not present

## 2018-12-09 DIAGNOSIS — F40298 Other specified phobia: Secondary | ICD-10-CM

## 2018-12-09 DIAGNOSIS — E663 Overweight: Secondary | ICD-10-CM

## 2018-12-09 DIAGNOSIS — Z6828 Body mass index (BMI) 28.0-28.9, adult: Secondary | ICD-10-CM

## 2018-12-09 LAB — COMPREHENSIVE METABOLIC PANEL
ALT: 38 U/L (ref 0–53)
AST: 33 U/L (ref 0–37)
Albumin: 4.5 g/dL (ref 3.5–5.2)
Alkaline Phosphatase: 45 U/L (ref 39–117)
BUN: 21 mg/dL (ref 6–23)
CO2: 30 mEq/L (ref 19–32)
Calcium: 9.4 mg/dL (ref 8.4–10.5)
Chloride: 102 mEq/L (ref 96–112)
Creatinine, Ser: 1.29 mg/dL (ref 0.40–1.50)
GFR: 58.53 mL/min — ABNORMAL LOW (ref >60.00)
Glucose, Bld: 103 mg/dL — ABNORMAL HIGH (ref 70–99)
Potassium: 4.3 mEq/L (ref 3.5–5.1)
Sodium: 138 mEq/L (ref 135–145)
Total Bilirubin: 0.6 mg/dL (ref 0.2–1.2)
Total Protein: 6.8 g/dL (ref 6.0–8.3)

## 2018-12-09 LAB — CBC
HCT: 46 % (ref 39.0–52.0)
Hemoglobin: 15.5 g/dL (ref 13.0–17.0)
MCHC: 33.7 g/dL (ref 30.0–36.0)
MCV: 93.4 fl (ref 78.0–100.0)
Platelets: 171 10*3/uL (ref 150.0–400.0)
RBC: 4.92 Mil/uL (ref 4.22–5.81)
RDW: 12.9 % (ref 11.5–15.5)
WBC: 3.3 10*3/uL — ABNORMAL LOW (ref 4.0–10.5)

## 2018-12-09 LAB — MAGNESIUM: Magnesium: 2.2 mg/dL (ref 1.5–2.5)

## 2018-12-09 LAB — LIPID PANEL
Cholesterol: 192 mg/dL (ref 0–200)
HDL: 49.9 mg/dL (ref >39.00)
LDL Cholesterol: 115 mg/dL — ABNORMAL HIGH (ref 0–99)
NonHDL: 142.48
Total CHOL/HDL Ratio: 4
Triglycerides: 135 mg/dL (ref 0.0–149.0)
VLDL: 27 mg/dL (ref 0.0–40.0)

## 2018-12-09 LAB — TSH: TSH: 2.4 u[IU]/mL (ref 0.35–4.50)

## 2018-12-09 LAB — VITAMIN B12: Vitamin B-12: 332 pg/mL (ref 211–911)

## 2018-12-09 MED ORDER — ACYCLOVIR 200 MG PO CAPS: 200.0000 mg | ORAL_CAPSULE | Freq: Every day | ORAL | 3 refills | Status: DC

## 2018-12-09 MED ORDER — FLUTICASONE PROPIONATE 50 MCG/ACT NA SUSP: NASAL | 11 refills | Status: DC

## 2018-12-09 MED ORDER — VALACYCLOVIR HCL 1 G PO TABS: 1000.0000 mg | ORAL_TABLET | Freq: Two times a day (BID) | ORAL | 0 refills | Status: DC

## 2018-12-09 MED ORDER — PROPRANOLOL HCL 20 MG PO TABS: ORAL_TABLET | ORAL | 0 refills | Status: DC

## 2018-12-09 MED ORDER — LORAZEPAM 1 MG PO TABS: ORAL_TABLET | ORAL | 1 refills | Status: DC

## 2018-12-09 NOTE — Progress Notes (Signed)
Please inform patient of the following:  Blood sugar and cholesterol levels are both borderline. Everything else is NORMAL or stable. Do not need to start or change medications. Recommend continuing to work on diet and exercise and we can recheck in a year.   Algis Greenhouse. Jerline Pain, MD 12/09/2018 1:21 PM

## 2018-12-09 NOTE — Assessment & Plan Note (Signed)
Check CBC, C met, TSH, and lipid panel.  Continue simvastatin 40 mg daily.

## 2018-12-09 NOTE — Assessment & Plan Note (Signed)
Refilled Flonase

## 2018-12-09 NOTE — Patient Instructions (Signed)
It was very nice to see you today!  Keep up the good work!  I will refill your medications today.  We will check blood work today.  Come back in 1 year for your next physical or sooner if needed.   Take care, Dr Jerline Pain  Please try these tips to maintain a healthy lifestyle:   Eat at least 3 REAL meals and 1-2 snacks per day.  Aim for no more than 5 hours between eating.  If you eat breakfast, please do so within one hour of getting up.    Obtain twice as many fruits/vegetables as protein or carbohydrate foods for both lunch and dinner. (Half of each meal should be fruits/vegetables, one quarter protein, and one quarter starchy carbs)   Cut down on sweet beverages. This includes juice, soda, and sweet tea.    Exercise at least 150 minutes every week.    Preventive Care 40-14 Years Old, Male Preventive care refers to lifestyle choices and visits with your health care provider that can promote health and wellness. This includes:  A yearly physical exam. This is also called an annual well check.  Regular dental and eye exams.  Immunizations.  Screening for certain conditions.  Healthy lifestyle choices, such as eating a healthy diet, getting regular exercise, not using drugs or products that contain nicotine and tobacco, and limiting alcohol use. What can I expect for my preventive care visit? Physical exam Your health care provider will check:  Height and weight. These may be used to calculate body mass index (BMI), which is a measurement that tells if you are at a healthy weight.  Heart rate and blood pressure.  Your skin for abnormal spots. Counseling Your health care provider may ask you questions about:  Alcohol, tobacco, and drug use.  Emotional well-being.  Home and relationship well-being.  Sexual activity.  Eating habits.  Work and work Statistician. What immunizations do I need?  Influenza (flu) vaccine  This is recommended every year. Tetanus,  diphtheria, and pertussis (Tdap) vaccine  You may need a Td booster every 10 years. Varicella (chickenpox) vaccine  You may need this vaccine if you have not already been vaccinated. Zoster (shingles) vaccine  You may need this after age 55. Measles, mumps, and rubella (MMR) vaccine  You may need at least one dose of MMR if you were born in 1957 or later. You may also need a second dose. Pneumococcal conjugate (PCV13) vaccine  You may need this if you have certain conditions and were not previously vaccinated. Pneumococcal polysaccharide (PPSV23) vaccine  You may need one or two doses if you smoke cigarettes or if you have certain conditions. Meningococcal conjugate (MenACWY) vaccine  You may need this if you have certain conditions. Hepatitis A vaccine  You may need this if you have certain conditions or if you travel or work in places where you may be exposed to hepatitis A. Hepatitis B vaccine  You may need this if you have certain conditions or if you travel or work in places where you may be exposed to hepatitis B. Haemophilus influenzae type b (Hib) vaccine  You may need this if you have certain risk factors. Human papillomavirus (HPV) vaccine  If recommended by your health care provider, you may need three doses over 6 months. You may receive vaccines as individual doses or as more than one vaccine together in one shot (combination vaccines). Talk with your health care provider about the risks and benefits of combination vaccines. What  tests do I need? Blood tests  Lipid and cholesterol levels. These may be checked every 5 years, or more frequently if you are over 56 years old.  Hepatitis C test.  Hepatitis B test. Screening  Lung cancer screening. You may have this screening every year starting at age 49 if you have a 30-pack-year history of smoking and currently smoke or have quit within the past 15 years.  Prostate cancer screening. Recommendations will vary  depending on your family history and other risks.  Colorectal cancer screening. All adults should have this screening starting at age 44 and continuing until age 86. Your health care provider may recommend screening at age 74 if you are at increased risk. You will have tests every 1-10 years, depending on your results and the type of screening test.  Diabetes screening. This is done by checking your blood sugar (glucose) after you have not eaten for a while (fasting). You may have this done every 1-3 years.  Sexually transmitted disease (STD) testing. Follow these instructions at home: Eating and drinking  Eat a diet that includes fresh fruits and vegetables, whole grains, lean protein, and low-fat dairy products.  Take vitamin and mineral supplements as recommended by your health care provider.  Do not drink alcohol if your health care provider tells you not to drink.  If you drink alcohol: ? Limit how much you have to 0-2 drinks a day. ? Be aware of how much alcohol is in your drink. In the [place] one drink equals one 12 oz bottle of beer (355 mL), one 5 oz glass of wine (148 mL), or one 1 oz glass of hard liquor (44 mL). Lifestyle  Take daily care of your teeth and gums.  Stay active. Exercise for at least 30 minutes on 5 or more days each week.  Do not use any products that contain nicotine or tobacco, such as cigarettes, e-cigarettes, and chewing tobacco. If you need help quitting, ask your health care provider.  If you are sexually active, practice safe sex. Use a condom or other form of protection to prevent STIs (sexually transmitted infections).  Talk with your health care provider about taking a low-dose aspirin every day starting at age 20. What's next?  Go to your health care provider once a year for a well check visit.  Ask your health care provider how often you should have your eyes and teeth checked.  Stay up to date on all vaccines. This information is not  intended to replace advice given to you by your health care provider. Make sure you discuss any questions you have with your health care provider. Document Released: 05/13/2015 Document Revised: 04/10/2018 Document Reviewed: 04/10/2018 Elsevier Patient Education  2020 [employer].

## 2018-12-09 NOTE — Progress Notes (Signed)
Chief Complaint:  Jeremy Paul is a 53 y.o. male who presents today for his annual comprehensive physical exam.    Assessment/Plan:  H/O cold sores Refilled acyclovir 200 mg daily and Valtrex 2000 mg twice daily as needed for outbreaks.  Hypogonadism, male Stable.  Continue management per urology.  GERD Stable.  Continue Nexium 20 mg daily.  Check B12 and magnesium.  Allergic rhinitis Refilled Flonase.  Specific phobia Refilled Ativan and propranolol.  Hyperlipidemia Check CBC, C met, TSH, and lipid panel.  Continue simvastatin 40 mg daily.   Body mass index is 28.64 kg/m. / Overweight BMI Metric Follow Up - 12/09/18 0829      BMI Metric Follow Up-Please document annually   BMI Metric Follow Up  Education provided        Preventative Healthcare: Tdap given today.  Due for flu vaccine later this year.  Patient Counseling(The following topics were reviewed and/or handout was given):  -Nutrition: Stressed importance of moderation in sodium/caffeine intake, saturated fat and cholesterol, caloric balance, sufficient intake of fresh fruits, vegetables, and fiber.  -Stressed the importance of regular exercise.   -Substance Abuse: Discussed cessation/primary prevention of tobacco, alcohol, or other drug use; driving or other dangerous activities under the influence; availability of treatment for abuse.   -Injury prevention: Discussed safety belts, safety helmets, smoke detector, smoking near bedding or upholstery.   -Sexuality: Discussed sexually transmitted diseases, partner selection, use of condoms, avoidance of unintended pregnancy and contraceptive alternatives.   -Dental health: Discussed importance of regular tooth brushing, flossing, and dental visits.  -Health maintenance and immunizations reviewed. Please refer to Health maintenance section.  Return to care in 1 year for next preventative visit.     Subjective:  HPI:  He has no acute complaints today.   His  stable, chronic medical conditions are outlined below:   # Specific Phobias -Takes Ativan as needed prior to plane rides.  This helps control symptoms.  No reported side effects. -Takes propranolol 20 mg as needed prior to public speaking.  This helps control symptoms.  No reported side effects.  # Dyslipidemia -Currently on simvastatin 40 mg daily and tolerating well. - ROS: No reported myalgias.  # History of cold sores - Currently on acyclovir 220m daily for suppressive therapy.  Tolerating well without side effects. - Takes valtrex 2000 mg for flareups. - No recent flares  # GERD with esophageal stricture - Currently on nexium 245mdaily and tolerating well without side effects.  # Allergic Rhinitis -Uses Flonase nasal spray as needed.  % Low Testosterone - Follows with urology - Currently takes testosterone cypionate [id number]  Lifestyle Diet: No specific diets or eating plans.  Exercise: Likes to golf and lift weights.   Depression screen PHQ 2/9 05/20/2018  Decreased Interest 0  Down, Depressed, Hopeless 0  PHQ - 2 Score 0    Health Maintenance Due  Topic Date Due  . HIV Screening  02/11/1982  . TETANUS/TDAP  07/27/2018  . INFLUENZA VACCINE  11/29/2018     ROS: Per HPI, otherwise a complete review of systems was negative.   PMH:  The following were reviewed and entered/updated in epic: Past Medical History:  Diagnosis Date  . Allergic rhinitis   . Anxiety   . Eosinophilic esophagitis   . GERD (gastroesophageal reflux disease)   . Herpes simplex   . Hyperlipidemia    Patient Active Problem List   Diagnosis Date Noted  . Tinnitus 05/20/2018  . H/O cold sores  08/21/2012  . Hypogonadism, male 05/29/2011  . Family history of colonic polyps 02/13/2011  . Hyperlipidemia 05/08/2007  . Specific phobia 05/08/2007  . Allergic rhinitis 05/08/2007  . GERD 05/08/2007   Past Surgical History:  Procedure Laterality Date  . INGUINAL HERNIA REPAIR  1989    right    Family History  Problem Relation Age of Onset  . Hypertension Father   . Thyroid disease Mother   . Colon polyps Mother   . Alcohol abuse Brother   . Colon polyps Brother   . Skin cancer Maternal Uncle   . Heart disease Paternal Grandmother   . Prostate cancer Neg Hx   . Colon cancer Neg Hx     Medications- reviewed and updated Current Outpatient Medications  Medication Sig Dispense Refill  . acyclovir (ZOVIRAX) 200 MG capsule Take 1 capsule (200 mg total) by mouth daily. 90 capsule 3  . esomeprazole (NEXIUM) 20 MG capsule Take 20 mg by mouth daily at 12 noon.    . fluticasone (FLONASE) 50 MCG/ACT nasal spray USE 1 SPRAY IN EACH NOSTRIL EVERY DAY 32 g 11  . LORazepam (ATIVAN) 1 MG tablet TAKE 1 TABLET BY MOUTH 1 HOURS PRIOR TO AIRPLANE TRAVEL 20 tablet 1  . propranolol (INDERAL) 20 MG tablet TAKE 1 TABLET BY MOUTH 1 HOUR PRIOR TO SPEAKING ENGAGEMENT. MAY TAKE UP TO 2 TIMES DAILY 180 tablet 0  . simvastatin (ZOCOR) 40 MG tablet Take 1 tablet (40 mg total) by mouth daily. 90 tablet 3  . testosterone cypionate (DEPOTESTOTERONE CYPIONATE) 100 MG/ML injection Inject 200 mg into the muscle once a week. For IM use only    . valACYclovir (VALTREX) 1000 MG tablet Take 1 tablet (1,000 mg total) by mouth 2 (two) times daily. 30 tablet 0   No current facility-administered medications for this visit.     Allergies-reviewed and updated No Known Allergies  Social History   Socioeconomic History  . Marital status: Married    Spouse name: Not on file  . Number of children: 3  . Years of education: Not on file  . Highest education level: Not on file  Occupational History  . 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 Financial resource strain: Not on file  . Food insecurity    Worry: Not on file    Inability: Not on file  . Transportation needs    Medical: Not on file    Non-medical: Not on file  Tobacco Use  . Smoking  status: Never Smoker  . Smokeless tobacco: Never Used  Substance and Sexual Activity  . Alcohol use: Yes    Comment: 1-2 drinks a day  . Drug use: No  . Sexual activity: Not on file  Lifestyle  . Physical activity    Days per week: Not on file    Minutes per session: Not on file  . Stress: Not on file  Relationships  . Social Herbalist on phone: Not on file    Gets together: Not on file    Attends religious service: Not on file    Active member of club or organization: Not on file    Attends meetings of clubs or organizations: Not on file    Relationship status: Not on file  Other Topics Concern  . Not on file  Social History Narrative  . Not on file        Objective:  Physical Exam: BP  110/77   Pulse 63   Temp (!) 97.3 F (36.3 C)   Ht '5\' 11"'  (1.803 m)   Wt 205 lb 6.1 oz (93.2 kg)   SpO2 98%   BMI 28.64 kg/m   Body mass index is 28.64 kg/m. Wt Readings from Last 3 Encounters:  12/09/18 205 lb 6.1 oz (93.2 kg)  05/20/18 208 lb 9.6 oz (94.6 kg)  01/23/17 197 lb (89.4 kg)   Gen: NAD, resting comfortably HEENT: TMs normal bilaterally. OP clear. No thyromegaly noted.  CV: RRR with no murmurs appreciated Pulm: NWOB, CTAB with no crackles, wheezes, or rhonchi GI: Normal bowel sounds present. Soft, Nontender, Nondistended. MSK: no edema, cyanosis, or clubbing noted Skin: warm, dry Neuro: CN2-12 grossly intact. Strength 5/5 in upper and lower extremities. Reflexes symmetric and intact bilaterally.  Psych: Normal affect and thought content     Tearra Ouk M. Jerline Pain, MD 12/09/2018 8:31 AM

## 2018-12-09 NOTE — Assessment & Plan Note (Signed)
Refilled acyclovir 200 mg daily and Valtrex 2000 mg twice daily as needed for outbreaks.

## 2018-12-09 NOTE — Assessment & Plan Note (Signed)
Refilled Ativan and propranolol.

## 2018-12-09 NOTE — Assessment & Plan Note (Signed)
Stable.  Continue management per urology. 

## 2018-12-09 NOTE — Assessment & Plan Note (Signed)
Stable.  Continue Nexium 20 mg daily.  Check B12 and magnesium.

## 2019-02-01 ENCOUNTER — Other Ambulatory Visit: Payer: Self-pay | Admitting: Family Medicine

## 2019-04-18 ENCOUNTER — Other Ambulatory Visit: Payer: Self-pay | Admitting: Family Medicine

## 2019-06-23 ENCOUNTER — Other Ambulatory Visit: Payer: Self-pay

## 2019-06-23 MED ORDER — ACYCLOVIR 200 MG PO CAPS: 200.0000 mg | ORAL_CAPSULE | Freq: Every day | ORAL | 1 refills | Status: DC

## 2020-01-09 ENCOUNTER — Other Ambulatory Visit: Payer: Self-pay | Admitting: Family Medicine

## 2020-03-10 ENCOUNTER — Other Ambulatory Visit: Payer: Self-pay | Admitting: Family Medicine

## 2020-04-01 ENCOUNTER — Encounter: Payer: Self-pay | Admitting: Family Medicine

## 2020-04-01 ENCOUNTER — Other Ambulatory Visit: Payer: Self-pay

## 2020-04-01 ENCOUNTER — Ambulatory Visit (INDEPENDENT_AMBULATORY_CARE_PROVIDER_SITE_OTHER): Payer: 59 | Admitting: Family Medicine

## 2020-04-01 VITALS — BP 156/82 | HR 67 | Temp 98.1°F | Ht 71.0 in | Wt 208.8 lb

## 2020-04-01 DIAGNOSIS — M7022 Olecranon bursitis, left elbow: Secondary | ICD-10-CM | POA: Diagnosis not present

## 2020-04-01 MED ORDER — DOXYCYCLINE HYCLATE 100 MG PO TABS: 100.0000 mg | ORAL_TABLET | Freq: Two times a day (BID) | ORAL | 0 refills | Status: DC

## 2020-04-01 MED ORDER — MELOXICAM 15 MG PO TABS: 15.0000 mg | ORAL_TABLET | Freq: Every day | ORAL | 0 refills | Status: DC

## 2020-04-01 NOTE — Addendum Note (Signed)
Addended by: Ardith Dark on: 04/01/2020 01:29 PM   Modules accepted: Orders

## 2020-04-01 NOTE — Progress Notes (Signed)
   Jeremy Paul is a 54 y.o. male who presents today for an office visit.  Assessment/Plan:  New/Acute Problems: Olecranon bursitis No red flags.  No signs of infection.  Will start meloxicam.  Discussed compression and ice.  Discussed avoidance of repetitive trauma to the area.  Discussed reasons to return to care.  Follow-up as needed.  Elevated blood pressure reading Normally well controlled.  Elevated today in setting of acute pain.  Continue home monitoring goal 140/90 or lower.    Subjective:  HPI:  Patient here for left elbow swelling.  Started about a week ago.  No obvious injuries or precipitating events.  Some pain and redness to the area.  Tried Advil which helped.  Has never anything like this in the past.       Objective:  Physical Exam: Temp 98.1 F (36.7 C) (Temporal)   Ht 5\' 11"  (1.803 m)   Wt 208 lb 12.8 oz (94.7 kg)   BMI 29.12 kg/m   Gen: No acute distress, resting comfortably MSK: Olecranon bursitis on left elbow.  Mild amount of surrounding erythema.  Minimally tender to palpation. Neuro: Grossly normal, moves all extremities Psych: Normal affect and thought content      Hal Norrington M. , MD 04/01/2020 1:05 PM

## 2020-04-01 NOTE — Patient Instructions (Signed)
It was very nice to see you today!  You have bursitis.  Please try the meloxicam.  Please use a compression sleeve to the area and to use ice a few times daily.  Let me know if not improving over the next couple of weeks.  Take care, Dr [name]  Please try these tips to maintain a healthy lifestyle:   Eat at least 3 REAL meals and 1-2 snacks per day.  Aim for no more than 5 hours between eating.  If you eat breakfast, please do so within one hour of getting up.    Each meal should contain half fruits/vegetables, one quarter protein, and one quarter carbs (no bigger than a computer mouse)   Cut down on sweet beverages. This includes juice, soda, and sweet tea.     Drink at least 1 glass of water with each meal and aim for at least 8 glasses per day   Exercise at least 150 minutes every week.

## 2020-07-26 ENCOUNTER — Other Ambulatory Visit: Payer: Self-pay

## 2020-07-26 ENCOUNTER — Telehealth: Payer: Self-pay

## 2020-07-26 MED ORDER — VALACYCLOVIR HCL 1 G PO TABS
1000.0000 mg | ORAL_TABLET | Freq: Two times a day (BID) | ORAL | 0 refills | Status: DC
Start: 2020-07-26 — End: 2021-10-02

## 2020-07-26 MED ORDER — VALACYCLOVIR HCL 1 G PO TABS: 1000.0000 mg | ORAL_TABLET | Freq: Two times a day (BID) | ORAL | 0 refills | Status: DC

## 2020-07-26 MED ORDER — LORAZEPAM 1 MG PO TABS
ORAL_TABLET | ORAL | 1 refills | Status: DC
Start: 2020-07-26 — End: 2021-10-02

## 2020-07-26 NOTE — Telephone Encounter (Signed)
.   LAST APPOINTMENT DATE: 04/01/2020   NEXT APPOINTMENT DATE:@5 /27/2022  MEDICATION:valACYclovir (VALTREX) 1000 MG tablet  LORazepam (ATIVAN) 1 MG tablet  PHARMACY:[id number] - , [place] - [address]   CLINICAL FILLS OUT ALL BELOW:   LAST REFILL:  QTY:  REFILL DATE:    OTHER COMMENTS:    Okay for refill?  Please advise

## 2020-07-26 NOTE — Telephone Encounter (Signed)
Rx request 

## 2020-09-23 ENCOUNTER — Encounter: Payer: 59 | Admitting: Family Medicine

## 2020-09-28 ENCOUNTER — Other Ambulatory Visit: Payer: Self-pay

## 2020-09-28 ENCOUNTER — Ambulatory Visit (INDEPENDENT_AMBULATORY_CARE_PROVIDER_SITE_OTHER): Payer: 59 | Admitting: Family Medicine

## 2020-09-28 ENCOUNTER — Encounter: Payer: Self-pay | Admitting: Family Medicine

## 2020-09-28 VITALS — BP 124/79 | HR 72 | Temp 98.0°F | Ht 71.0 in | Wt 206.2 lb

## 2020-09-28 DIAGNOSIS — K2100 Gastro-esophageal reflux disease with esophagitis, without bleeding: Secondary | ICD-10-CM

## 2020-09-28 DIAGNOSIS — E291 Testicular hypofunction: Secondary | ICD-10-CM | POA: Diagnosis not present

## 2020-09-28 DIAGNOSIS — J301 Allergic rhinitis due to pollen: Secondary | ICD-10-CM

## 2020-09-28 DIAGNOSIS — E7800 Pure hypercholesterolemia, unspecified: Secondary | ICD-10-CM | POA: Diagnosis not present

## 2020-09-28 DIAGNOSIS — R739 Hyperglycemia, unspecified: Secondary | ICD-10-CM | POA: Diagnosis not present

## 2020-09-28 DIAGNOSIS — E663 Overweight: Secondary | ICD-10-CM

## 2020-09-28 DIAGNOSIS — Z0001 Encounter for general adult medical examination with abnormal findings: Secondary | ICD-10-CM

## 2020-09-28 DIAGNOSIS — Z6828 Body mass index (BMI) 28.0-28.9, adult: Secondary | ICD-10-CM

## 2020-09-28 DIAGNOSIS — F40298 Other specified phobia: Secondary | ICD-10-CM

## 2020-09-28 LAB — CBC WITH DIFFERENTIAL/PLATELET
Basophils Absolute: 0 10*3/uL (ref 0.0–0.1)
Basophils Relative: 0.7 % (ref 0.0–3.0)
Eosinophils Absolute: 0.3 10*3/uL (ref 0.0–0.7)
Eosinophils Relative: 6.3 % — ABNORMAL HIGH (ref 0.0–5.0)
HCT: 47.3 % (ref 39.0–52.0)
Hemoglobin: 16.1 g/dL (ref 13.0–17.0)
Lymphocytes Relative: 25.5 % (ref 12.0–46.0)
Lymphs Abs: 1.2 10*3/uL (ref 0.7–4.0)
MCHC: 34 g/dL (ref 30.0–36.0)
MCV: 93.1 fl (ref 78.0–100.0)
Monocytes Absolute: 0.5 10*3/uL (ref 0.1–1.0)
Monocytes Relative: 10.9 % (ref 3.0–12.0)
Neutro Abs: 2.7 10*3/uL (ref 1.4–7.7)
Neutrophils Relative %: 56.6 % (ref 43.0–77.0)
Platelets: 176 10*3/uL (ref 150.0–400.0)
RBC: 5.08 Mil/uL (ref 4.22–5.81)
RDW: 13.5 % (ref 11.5–15.5)
WBC: 4.8 10*3/uL (ref 4.0–10.5)

## 2020-09-28 LAB — COMPREHENSIVE METABOLIC PANEL
ALT: 35 U/L (ref 0–53)
AST: 31 U/L (ref 0–37)
Albumin: 4.4 g/dL (ref 3.5–5.2)
Alkaline Phosphatase: 49 U/L (ref 39–117)
BUN: 22 mg/dL (ref 6–23)
CO2: 27 mEq/L (ref 19–32)
Calcium: 9.8 mg/dL (ref 8.4–10.5)
Chloride: 102 mEq/L (ref 96–112)
Creatinine, Ser: 1.21 mg/dL (ref 0.40–1.50)
GFR: 68.3 mL/min (ref >60.00)
Glucose, Bld: 94 mg/dL (ref 70–99)
Potassium: 4.4 mEq/L (ref 3.5–5.1)
Sodium: 138 mEq/L (ref 135–145)
Total Bilirubin: 0.6 mg/dL (ref 0.2–1.2)
Total Protein: 7.2 g/dL (ref 6.0–8.3)

## 2020-09-28 LAB — TSH: TSH: 2.14 u[IU]/mL (ref 0.35–4.50)

## 2020-09-28 LAB — HEMOGLOBIN A1C: Hgb A1c MFr Bld: 5.5 % (ref 4.6–6.5)

## 2020-09-28 LAB — LIPID PANEL
Cholesterol: 181 mg/dL (ref 0–200)
HDL: 56.1 mg/dL (ref >39.00)
LDL Cholesterol: 103 mg/dL — ABNORMAL HIGH (ref 0–99)
NonHDL: 125.18
Total CHOL/HDL Ratio: 3
Triglycerides: 113 mg/dL (ref 0.0–149.0)
VLDL: 22.6 mg/dL (ref 0.0–40.0)

## 2020-09-28 MED ORDER — PROPRANOLOL HCL 20 MG PO TABS: ORAL_TABLET | ORAL | 3 refills | Status: DC

## 2020-09-28 MED ORDER — FLUTICASONE PROPIONATE 50 MCG/ACT NA SUSP: NASAL | 11 refills | Status: DC

## 2020-09-28 NOTE — Assessment & Plan Note (Signed)
Check A1c. 

## 2020-09-28 NOTE — Assessment & Plan Note (Signed)
Stable.  Continue management per urology. 

## 2020-09-28 NOTE — Patient Instructions (Signed)
It was very nice to see you today!  We will check blood work.  I will also order a cardiac CT scan.  Please continue working on diet and exercise.  I will see back in a year for your next physical.  Come back to see me sooner if needed.  Take care, Dr [name]  PLEASE NOTE:  If you had any lab tests please let us know if you have not heard back within a few days. You may see your results on mychart before we have a chance to review them but we will give you a call once they are reviewed by [place]. If we ordered any referrals today, please let us know if you have not heard from their office within the next week.   Please try these tips to maintain a healthy lifestyle:   Eat at least 3 REAL meals and 1-2 snacks per day.  Aim for no more than 5 hours between eating.  If you eat breakfast, please do so within one hour of getting up.    Each meal should contain half fruits/vegetables, one quarter protein, and one quarter carbs (no bigger than a computer mouse)   Cut down on sweet beverages. This includes juice, soda, and sweet tea.     Drink at least 1 glass of water with each meal and aim for at least 8 glasses per day   Exercise at least 150 minutes every week.    Preventive Care 23-19 Years Old, Male Preventive care refers to lifestyle choices and visits with your health care provider that can promote health and wellness. This includes:  A yearly physical exam. This is also called an annual wellness visit.  Regular dental and eye exams.  Immunizations.  Screening for certain conditions.  Healthy lifestyle choices, such as: ? Eating a healthy diet. ? Getting regular exercise. ? Not using drugs or products that contain nicotine and tobacco. ? Limiting alcohol use. What can I expect for my preventive care visit? Physical exam Your health care provider will check your:  Height and weight. These may be used to calculate your BMI (body mass index). BMI is a measurement that  tells if you are at a healthy weight.  Heart rate and blood pressure.  Body temperature.  Skin for abnormal spots. Counseling Your health care provider may ask you questions about your:  Past medical problems.  Family's medical history.  Alcohol, tobacco, and drug use.  Emotional well-being.  Home life and relationship well-being.  Sexual activity.  Diet, exercise, and sleep habits.  Work and work Astronomer.  Access to firearms. What immunizations do I need? Vaccines are usually given at various ages, according to a schedule. Your health care provider will recommend vaccines for you based on your age, medical history, and lifestyle or other factors, such as travel or where you work.   What tests do I need? Blood tests  Lipid and cholesterol levels. These may be checked every 5 years, or more often if you are over 34 years old.  Hepatitis C test.  Hepatitis B test. Screening  Lung cancer screening. You may have this screening every year starting at age 62 if you have a 30-pack-year history of smoking and currently smoke or have quit within the past 15 years.  Prostate cancer screening. Recommendations will vary depending on your family history and other risks.  Genital exam to check for testicular cancer or hernias.  Colorectal cancer screening. ? All adults should have this screening starting  at age 51 and continuing until age 52. ? Your health care provider may recommend screening at age 21 if you are at increased risk. ? You will have tests every 1-10 years, depending on your results and the type of screening test.  Diabetes screening. ? This is done by checking your blood sugar (glucose) after you have not eaten for a while (fasting). ? You may have this done every 1-3 years.  STD (sexually transmitted disease) testing, if you are at risk. Follow these instructions at home: Eating and drinking  Eat a diet that includes fresh fruits and vegetables, whole  grains, lean protein, and low-fat dairy products.  Take vitamin and mineral supplements as recommended by your health care provider.  Do not drink alcohol if your health care provider tells you not to drink.  If you drink alcohol: ? Limit how much you have to 0-2 drinks a day. ? Be aware of how much alcohol is in your drink. In the [place] one drink equals one 12 oz bottle of beer (355 mL), one 5 oz glass of wine (148 mL), or one 1 oz glass of hard liquor (44 mL).   Lifestyle  Take daily care of your teeth and gums. Brush your teeth every morning and night with fluoride toothpaste. Floss one time each day.  Stay active. Exercise for at least 30 minutes 5 or more days each week.  Do not use any products that contain nicotine or tobacco, such as cigarettes, e-cigarettes, and chewing tobacco. If you need help quitting, ask your health care provider.  Do not use drugs.  If you are sexually active, practice safe sex. Use a condom or other form of protection to prevent STIs (sexually transmitted infections).  If told by your health care provider, take low-dose aspirin daily starting at age 29.  Find healthy ways to cope with stress, such as: ? Meditation, yoga, or listening to music. ? Journaling. ? Talking to a trusted person. ? Spending time with friends and family. Safety  Always wear your seat belt while driving or riding in a vehicle.  Do not drive: ? If you have been drinking alcohol. Do not ride with someone who has been drinking. ? When you are tired or distracted. ? While texting.  Wear a helmet and other protective equipment during sports activities.  If you have firearms in your house, make sure you follow all gun safety procedures. What's next?  Go to your health care provider once a year for an annual wellness visit.  Ask your health care provider how often you should have your eyes and teeth checked.  Stay up to date on all vaccines. This information is not  intended to replace advice given to you by your health care provider. Make sure you discuss any questions you have with your health care provider. Document Revised: 01/13/2019 Document Reviewed: 04/10/2018 Elsevier Patient Education  2021 [employer].

## 2020-09-28 NOTE — Assessment & Plan Note (Signed)
Stable on Flonase.

## 2020-09-28 NOTE — Assessment & Plan Note (Signed)
On simvastatin 40 mg daily.  Check lipids today.

## 2020-09-28 NOTE — Progress Notes (Signed)
Chief Complaint:  Jeremy Paul is a 55 y.o. male who presents today for his annual comprehensive physical exam.    Assessment/Plan:  Chronic Problems Addressed Today: Hypogonadism, male Stable.  Continue management per urology.  Hyperglycemia Check A1c.  GERD Stable on Nexium 20 mg daily.  Allergic rhinitis Stable on Flonase.  Specific phobia Takes Ativan as needed prior to plane rides and propranolol as needed prior to public speaking.  Does not need refills today.  Continue current doses as needed.  Hyperlipidemia On simvastatin 40 mg daily.  Check lipids today.  Body mass index is 28.76 kg/m. / Overweight  BMI Metric Follow Up - 09/28/20 1028      BMI Metric Follow Up-Please document annually   BMI Metric Follow Up Education provided           Preventative Healthcare: He will check with insurance about shingles vaccine.  We will check labs today.  Up-to-date on cancer screening.  Patient Counseling(The following topics were reviewed and/or handout was given):  -Nutrition: Stressed importance of moderation in sodium/caffeine intake, saturated fat and cholesterol, caloric balance, sufficient intake of fresh fruits, vegetables, and fiber.  -Stressed the importance of regular exercise.   -Substance Abuse: Discussed cessation/primary prevention of tobacco, alcohol, or other drug use; driving or other dangerous activities under the influence; availability of treatment for abuse.   -Injury prevention: Discussed safety belts, safety helmets, smoke detector, smoking near bedding or upholstery.   -Sexuality: Discussed sexually transmitted diseases, partner selection, use of condoms, avoidance of unintended pregnancy and contraceptive alternatives.   -Dental health: Discussed importance of regular tooth brushing, flossing, and dental visits.  -Health maintenance and immunizations reviewed. Please refer to Health maintenance section.  Return to care in 1 year for next  preventative visit.     Subjective:  HPI:  He has no acute complaints today.   Lifestyle Diet: Balanced.  Exercise: Likes weight lifting 5 times per week.   Depression screen PHQ 2/9 09/28/2020  Decreased Interest 0  Down, Depressed, Hopeless 0  PHQ - 2 Score 0  Altered sleeping -  Tired, decreased energy -  Change in appetite -  Feeling bad or failure about yourself  -  Trouble concentrating -  Moving slowly or fidgety/restless -  Suicidal thoughts -  PHQ-9 Score -    Health Maintenance Due  Topic Date Due  . HIV Screening  Never done  . Hepatitis C Screening  Never done     ROS: Per HPI, otherwise a complete review of systems was negative.   PMH:  The following were reviewed and entered/updated in epic: Past Medical History:  Diagnosis Date  . Allergic rhinitis   . Anxiety   . Eosinophilic esophagitis   . GERD (gastroesophageal reflux disease)   . Herpes simplex   . Hyperlipidemia    Patient Active Problem List   Diagnosis Date Noted  . Hyperglycemia 12/09/2018  . Tinnitus 05/20/2018  . H/O cold sores 08/21/2012  . Hypogonadism, male 05/29/2011  . Family history of colonic polyps 02/13/2011  . Hyperlipidemia 05/08/2007  . Specific phobia 05/08/2007  . Allergic rhinitis 05/08/2007  . GERD 05/08/2007   Past Surgical History:  Procedure Laterality Date  . INGUINAL HERNIA REPAIR  1989   right    Family History  Problem Relation Age of Onset  . Hypertension Father   . Thyroid disease Mother   . Colon polyps Mother   . Alcohol abuse Brother   . Colon polyps Brother   .  Skin cancer Maternal Uncle   . Heart disease Paternal Grandmother   . Prostate cancer Neg Hx   . Colon cancer Neg Hx     Medications- reviewed and updated Current Outpatient Medications  Medication Sig Dispense Refill  . acyclovir (ZOVIRAX) 200 MG capsule TAKE 1 CAPSULE BY MOUTH  DAILY 90 capsule 1  . esomeprazole (NEXIUM) 20 MG capsule Take 20 mg by mouth daily at 12 noon.     Marland Kitchen LORazepam (ATIVAN) 1 MG tablet TAKE 1 TABLET BY MOUTH 1 HOURS PRIOR TO AIRPLANE TRAVEL 20 tablet 1  . simvastatin (ZOCOR) 40 MG tablet TAKE 1 TABLET BY MOUTH  DAILY 90 tablet 3  . testosterone cypionate (DEPOTESTOTERONE CYPIONATE) 100 MG/ML injection Inject 200 mg into the muscle once a week. For IM use only    . valACYclovir (VALTREX) 1000 MG tablet Take 1 tablet (1,000 mg total) by mouth 2 (two) times daily. 30 tablet 0  . fluticasone (FLONASE) 50 MCG/ACT nasal spray USE 1 SPRAY IN EACH NOSTRIL EVERY DAY 32 g 11  . propranolol (INDERAL) 20 MG tablet TAKE 1 TABLET BY MOUTH 1  HOUR PRIOR TO SPEAKING  ENGAGEMENT. MAY TAKE UP TO  TWICE DAILY 180 tablet 3   No current facility-administered medications for this visit.    Allergies-reviewed and updated No Known Allergies  Social History   Socioeconomic History  . Marital status: Married    Spouse name: Not on file  . Number of children: 3  . Years of education: Not on file  . Highest education level: Not on file  Occupational History  . 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 Smoking status: Never Smoker  . Smokeless tobacco: Never Used  Substance and Sexual Activity  . Alcohol use: Yes    Comment: 1-2 drinks a day  . Drug use: No  . Sexual activity: Not on file  Other Topics Concern  . Not on file  Social History Narrative  . Not on file   Social Determinants of Health   Financial Resource Strain: Not on file  Food Insecurity: Not on file  Transportation Needs: Not on file  Physical Activity: Not on file  Stress: Not on file  Social Connections: Not on file        Objective:  Physical Exam: BP 124/79   Pulse 72   Temp 98 F (36.7 C) (Temporal)   Ht 5\' 11"  (1.803 m)   Wt 206 lb 3.2 oz (93.5 kg)   SpO2 95%   BMI 28.76 kg/m   Body mass index is 28.76 kg/m. Wt Readings from Last 3 Encounters:  09/28/20 206 lb 3.2 oz (93.5 kg)  04/01/20 208 lb 12.8 oz (94.7  kg)  12/09/18 205 lb 6.1 oz (93.2 kg)   Gen: NAD, resting comfortably HEENT: TMs normal bilaterally. OP clear. No thyromegaly noted.  CV: RRR with no murmurs appreciated Pulm: NWOB, CTAB with no crackles, wheezes, or rhonchi GI: Normal bowel sounds present. Soft, Nontender, Nondistended. MSK: no edema, cyanosis, or clubbing noted Skin: warm, dry Neuro: CN2-12 grossly intact. Strength 5/5 in upper and lower extremities. Reflexes symmetric and intact bilaterally.  Psych: Normal affect and thought content     Jeremy Mattie M. 02/08/19, MD 09/28/2020 10:28 AM

## 2020-09-28 NOTE — Assessment & Plan Note (Signed)
Stable on Nexium 20 mg daily. 

## 2020-09-28 NOTE — Assessment & Plan Note (Signed)
Takes Ativan as needed prior to plane rides and propranolol as needed prior to public speaking.  Does not need refills today.  Continue current doses as needed.

## 2020-09-30 NOTE — Progress Notes (Signed)
Please inform patient of the following:  His "bad" cholesterol is little bit elevated but everything else is normal.  Cholesterol is better than last year.  Do not need to make any changes to his treatment plan at this time.  I would like for him to continue the good work with his diet and exercise.  We can recheck in year.

## 2020-11-08 ENCOUNTER — Ambulatory Visit (INDEPENDENT_AMBULATORY_CARE_PROVIDER_SITE_OTHER)
Admission: RE | Admit: 2020-11-08 | Discharge: 2020-11-08 | Disposition: A | Discharge status: Home / Self Care | Payer: Self-pay | Source: Ambulatory Visit | Attending: Family Medicine | Admitting: Family Medicine

## 2020-11-08 ENCOUNTER — Other Ambulatory Visit: Payer: Self-pay

## 2020-11-08 DIAGNOSIS — E7800 Pure hypercholesterolemia, unspecified: Secondary | ICD-10-CM

## 2020-11-09 NOTE — Progress Notes (Signed)
Please inform patient of the following:  Good news! His CT calcium score is 0. This means he has no calcifcations in his heart. We can recheck again in 5-10 years.  Jeremy Paul. [name], MD 11/09/2020 9:00 AM

## 2021-02-10 ENCOUNTER — Other Ambulatory Visit: Payer: Self-pay | Admitting: Family Medicine

## 2021-05-02 ENCOUNTER — Other Ambulatory Visit: Payer: Self-pay | Admitting: Family Medicine

## 2021-05-20 ENCOUNTER — Other Ambulatory Visit: Payer: Self-pay | Admitting: Family Medicine

## 2021-10-02 ENCOUNTER — Other Ambulatory Visit: Payer: Self-pay | Admitting: *Deleted

## 2021-10-02 MED ORDER — PROPRANOLOL HCL 20 MG PO TABS: ORAL_TABLET | ORAL | 0 refills | Status: DC

## 2021-10-02 MED ORDER — VALACYCLOVIR HCL 1 G PO TABS: 1000.0000 mg | ORAL_TABLET | Freq: Two times a day (BID) | ORAL | 0 refills | Status: DC

## 2021-10-02 MED ORDER — LORAZEPAM 1 MG PO TABS: ORAL_TABLET | ORAL | 0 refills | Status: DC

## 2021-10-02 NOTE — Telephone Encounter (Signed)
   LAST APPOINTMENT DATE:   09/28/2020   NEXT APPOINTMENT DATE: 10/26/21 CPE  MEDICATION: LORazepam (ATIVAN) 1 MG tablet [[id number]]   propranolol (INDERAL) 20 MG tablet [[id number]]   Is the patient out of medication?  Yes   PHARMACY: [id number] [place], [place] - [address] AT [facility] [address] [facility]  [address] [place], [place] [address]  Phone:  [phone]  Fax:  [phone]

## 2021-10-02 NOTE — Addendum Note (Signed)
Addended by: Dyann Kief on: 10/02/2021 11:32 AM   Modules accepted: Orders

## 2021-10-26 ENCOUNTER — Encounter: Payer: 59 | Admitting: Family Medicine

## 2021-11-19 ENCOUNTER — Other Ambulatory Visit: Payer: Self-pay | Admitting: Family Medicine

## 2021-11-28 ENCOUNTER — Encounter: Payer: Self-pay | Admitting: Family Medicine

## 2021-11-28 ENCOUNTER — Ambulatory Visit (INDEPENDENT_AMBULATORY_CARE_PROVIDER_SITE_OTHER): Payer: 59 | Admitting: Family Medicine

## 2021-11-28 VITALS — BP 120/80 | HR 74 | Temp 98.0°F | Ht 71.0 in | Wt 206.6 lb

## 2021-11-28 DIAGNOSIS — Z0001 Encounter for general adult medical examination with abnormal findings: Secondary | ICD-10-CM

## 2021-11-28 DIAGNOSIS — J301 Allergic rhinitis due to pollen: Secondary | ICD-10-CM

## 2021-11-28 DIAGNOSIS — E291 Testicular hypofunction: Secondary | ICD-10-CM

## 2021-11-28 DIAGNOSIS — R739 Hyperglycemia, unspecified: Secondary | ICD-10-CM | POA: Diagnosis not present

## 2021-11-28 DIAGNOSIS — E7800 Pure hypercholesterolemia, unspecified: Secondary | ICD-10-CM

## 2021-11-28 DIAGNOSIS — F40298 Other specified phobia: Secondary | ICD-10-CM

## 2021-11-28 DIAGNOSIS — H9311 Tinnitus, right ear: Secondary | ICD-10-CM

## 2021-11-28 LAB — COMPREHENSIVE METABOLIC PANEL
ALT: 32 U/L (ref 0–53)
AST: 23 U/L (ref 0–37)
Albumin: 4.5 g/dL (ref 3.5–5.2)
Alkaline Phosphatase: 46 U/L (ref 39–117)
BUN: 14 mg/dL (ref 6–23)
CO2: 30 mEq/L (ref 19–32)
Calcium: 9.5 mg/dL (ref 8.4–10.5)
Chloride: 100 mEq/L (ref 96–112)
Creatinine, Ser: 1.38 mg/dL (ref 0.40–1.50)
GFR: 57.86 mL/min — ABNORMAL LOW (ref >60.00)
Glucose, Bld: 95 mg/dL (ref 70–99)
Potassium: 4.2 mEq/L (ref 3.5–5.1)
Sodium: 137 mEq/L (ref 135–145)
Total Bilirubin: 0.8 mg/dL (ref 0.2–1.2)
Total Protein: 7.5 g/dL (ref 6.0–8.3)

## 2021-11-28 LAB — LIPID PANEL
Cholesterol: 194 mg/dL (ref 0–200)
HDL: 53.7 mg/dL (ref >39.00)
LDL Cholesterol: 101 mg/dL — ABNORMAL HIGH (ref 0–99)
NonHDL: 139.99
Total CHOL/HDL Ratio: 4
Triglycerides: 193 mg/dL — ABNORMAL HIGH (ref 0.0–149.0)
VLDL: 38.6 mg/dL (ref 0.0–40.0)

## 2021-11-28 LAB — CBC
HCT: 45.5 % (ref 39.0–52.0)
Hemoglobin: 15.3 g/dL (ref 13.0–17.0)
MCHC: 33.7 g/dL (ref 30.0–36.0)
MCV: 93.8 fl (ref 78.0–100.0)
Platelets: 148 10*3/uL — ABNORMAL LOW (ref 150.0–400.0)
RBC: 4.85 Mil/uL (ref 4.22–5.81)
RDW: 13.5 % (ref 11.5–15.5)
WBC: 4.3 10*3/uL (ref 4.0–10.5)

## 2021-11-28 LAB — TSH: TSH: 3.12 u[IU]/mL (ref 0.35–5.50)

## 2021-11-28 LAB — HEMOGLOBIN A1C: Hgb A1c MFr Bld: 5.6 % (ref 4.6–6.5)

## 2021-11-28 NOTE — Patient Instructions (Signed)
It was very nice to see you today!  We will check blood work today.   Keep up the good work with diet and exercise.  You are due for your colonoscopy later this year.  You are also due for your shingles vaccine.  We will see back in year for your next physical.  Please come back to see [place] sooner if needed.  Take care, Dr [name]  PLEASE NOTE:  If you had any lab tests please let us know if you have not heard back within a few days. You may see your results on mychart before we have a chance to review them but we will give you a call once they are reviewed by [place]. If we ordered any referrals today, please let us know if you have not heard from their office within the next week.   Please try these tips to maintain a healthy lifestyle:  Eat at least 3 REAL meals and 1-2 snacks per day.  Aim for no more than 5 hours between eating.  If you eat breakfast, please do so within one hour of getting up.   Each meal should contain half fruits/vegetables, one quarter protein, and one quarter carbs (no bigger than a computer mouse)  Cut down on sweet beverages. This includes juice, soda, and sweet tea.   Drink at least 1 glass of water with each meal and aim for at least 8 glasses per day  Exercise at least 150 minutes every week.    Preventive Care 10-53 Years Old, Male Preventive care refers to lifestyle choices and visits with your health care provider that can promote health and wellness. Preventive care visits are also called wellness exams. What can I expect for my preventive care visit? Counseling During your preventive care visit, your health care provider may ask about your: Medical history, including: Past medical problems. Family medical history. Current health, including: Emotional well-being. Home life and relationship well-being. Sexual activity. Lifestyle, including: Alcohol, nicotine or tobacco, and drug use. Access to firearms. Diet, exercise, and sleep habits. Safety  issues such as seatbelt and bike helmet use. Sunscreen use. Work and work Astronomer. Physical exam Your health care provider will check your: Height and weight. These may be used to calculate your BMI (body mass index). BMI is a measurement that tells if you are at a healthy weight. Waist circumference. This measures the distance around your waistline. This measurement also tells if you are at a healthy weight and may help predict your risk of certain diseases, such as type 2 diabetes and high blood pressure. Heart rate and blood pressure. Body temperature. Skin for abnormal spots. What immunizations do I need?  Vaccines are usually given at various ages, according to a schedule. Your health care provider will recommend vaccines for you based on your age, medical history, and lifestyle or other factors, such as travel or where you work. What tests do I need? Screening Your health care provider may recommend screening tests for certain conditions. This may include: Lipid and cholesterol levels. Diabetes screening. This is done by checking your blood sugar (glucose) after you have not eaten for a while (fasting). Hepatitis B test. Hepatitis C test. HIV (human immunodeficiency virus) test. STI (sexually transmitted infection) testing, if you are at risk. Lung cancer screening. Prostate cancer screening. Colorectal cancer screening. Talk with your health care provider about your test results, treatment options, and if necessary, the need for more tests. Follow these instructions at home: Eating and drinking  Eat a diet that includes fresh fruits and vegetables, whole grains, lean protein, and low-fat dairy products. Take vitamin and mineral supplements as recommended by your health care provider. Do not drink alcohol if your health care provider tells you not to drink. If you drink alcohol: Limit how much you have to 0-2 drinks a day. Know how much alcohol is in your drink. In the  [place] one drink equals one 12 oz bottle of beer (355 mL), one 5 oz glass of wine (148 mL), or one 1 oz glass of hard liquor (44 mL). Lifestyle Brush your teeth every morning and night with fluoride toothpaste. Floss one time each day. Exercise for at least 30 minutes 5 or more days each week. Do not use any products that contain nicotine or tobacco. These products include cigarettes, chewing tobacco, and vaping devices, such as e-cigarettes. If you need help quitting, ask your health care provider. Do not use drugs. If you are sexually active, practice safe sex. Use a condom or other form of protection to prevent STIs. Take aspirin only as told by your health care provider. Make sure that you understand how much to take and what form to take. Work with your health care provider to find out whether it is safe and beneficial for you to take aspirin daily. Find healthy ways to manage stress, such as: Meditation, yoga, or listening to music. Journaling. Talking to a trusted person. Spending time with friends and family. Minimize exposure to UV radiation to reduce your risk of skin cancer. Safety Always wear your seat belt while driving or riding in a vehicle. Do not drive: If you have been drinking alcohol. Do not ride with someone who has been drinking. When you are tired or distracted. While texting. If you have been using any mind-altering substances or drugs. Wear a helmet and other protective equipment during sports activities. If you have firearms in your house, make sure you follow all gun safety procedures. What's next? Go to your health care provider once a year for an annual wellness visit. Ask your health care provider how often you should have your eyes and teeth checked. Stay up to date on all vaccines. This information is not intended to replace advice given to you by your health care provider. Make sure you discuss any questions you have with your health care  provider. Document Revised: 10/12/2020 Document Reviewed: 10/12/2020 Elsevier Patient Education  [place].

## 2021-11-28 NOTE — Progress Notes (Signed)
Chief Complaint:  Jeremy Paul is a 55 y.o. male who presents today for his annual comprehensive physical exam.    Assessment/Plan:  Chronic Problems Addressed Today: Hypogonadism, male On testosterone replacement per urology.  Gets PSA screening through them.  Hyperglycemia Check A1c.  Tinnitus No red flags.  [facility].  May benefit from hearing aid.  Allergic rhinitis Stable on over-the-counter meds as needed.  Specific phobia Takes Ativan as needed prior to plane rides and Pregnyl as needed for public speaking.  Does not need refills today.  Medications help with his phobia.  No significant side effects.  Will refill as needed.  Hyperlipidemia Check lipids.  He is currently on simvastatin 40 mg daily doing well.  He did have a coronary calcium score of 0 last year.  We had a brief discussion about continuing with statin given his score of 0.  At this point he is tolerating well without any significant side effects and would like to keep going with current dose for now.  We will continue to monitor his lipids annually and we can repeat his coronary artery calcium score in 5 to 10 years.  Preventative Healthcare: Due for repeat colonoscopy later this year.  Check labs today.  Due for shingles vaccine-she is not sure about this yet but will follow up here or at the pharmacy if he would like to get this done.   Patient Counseling(The following topics were reviewed and/or handout was given):  -Nutrition: Stressed importance of moderation in sodium/caffeine intake, saturated fat and cholesterol, caloric balance, sufficient intake of fresh fruits, vegetables, and fiber.  -Stressed the importance of regular exercise.   -Substance Abuse: Discussed cessation/primary prevention of tobacco, alcohol, or other drug use; driving or other dangerous activities under the influence; availability of treatment for abuse.   -Injury prevention:  Discussed safety belts, safety helmets, smoke detector, smoking near bedding or upholstery.   -Sexuality: Discussed sexually transmitted diseases, partner selection, use of condoms, avoidance of unintended pregnancy and contraceptive alternatives.   -Dental health: Discussed importance of regular tooth brushing, flossing, and dental visits.  -Health maintenance and immunizations reviewed. Please refer to Health maintenance section.  Return to care in 1 year for next preventative visit.     Subjective:  HPI:  He has no acute complaints today.   Lifestyle Diet: Balanced. Plenty of fruits and vegetables.  Exercise: Works out 4 times per week with weight training. Also goes golfing.      11/28/2021    7:58 AM  Depression screen PHQ 2/9  Decreased Interest 0  Down, Depressed, Hopeless 0  PHQ - 2 Score 0  Altered sleeping 0  Tired, decreased energy 0  Change in appetite 0  Feeling bad or failure about yourself  0  Trouble concentrating 0  Moving slowly or fidgety/restless 0  Suicidal thoughts 0  PHQ-9 Score 0  Difficult doing work/chores Not difficult at all    Health Maintenance Due  Topic Date Due   HIV Screening  Never done   Hepatitis C Screening  Never done   INFLUENZA VACCINE  11/28/2021     ROS: Per HPI, otherwise a complete review of systems was negative.   PMH:  The following were reviewed and entered/updated in epic: Past Medical History:  Diagnosis Date   Allergic rhinitis    Anxiety    Eosinophilic esophagitis    GERD (gastroesophageal reflux disease)    Herpes simplex  Hyperlipidemia    Patient Active Problem List   Diagnosis Date Noted   Hyperglycemia 12/09/2018   Tinnitus 05/20/2018   H/O cold sores 08/21/2012   Hypogonadism, male 05/29/2011   Family history of colonic polyps 02/13/2011   Hyperlipidemia 05/08/2007   Specific phobia 05/08/2007   Allergic rhinitis 05/08/2007   GERD 05/08/2007   Past Surgical History:  Procedure Laterality  Date   INGUINAL HERNIA REPAIR  1989   right    Family History  Problem Relation Age of Onset   Hypertension Father    Thyroid disease Mother    Colon polyps Mother    Alcohol abuse Brother    Colon polyps Brother    Skin cancer Maternal Uncle    Heart disease Paternal Grandmother    Prostate cancer Neg Hx    Colon cancer Neg Hx     Medications- reviewed and updated Current Outpatient Medications  Medication Sig Dispense Refill   acyclovir (ZOVIRAX) 200 MG capsule TAKE 1 CAPSULE BY MOUTH ONCE  DAILY 90 capsule 1   esomeprazole (NEXIUM) 20 MG capsule Take 20 mg by mouth daily at 12 noon.     LORazepam (ATIVAN) 1 MG tablet TAKE 1 TABLET BY MOUTH 1 HOURS PRIOR TO AIRPLANE TRAVEL 20 tablet 0   propranolol (INDERAL) 20 MG tablet TAKE 1 TABLET BY MOUTH 1  HOUR PRIOR TO SPEAKING  ENGAGEMENT. MAY TAKE UP TO  TWICE DAILY 180 tablet 0   simvastatin (ZOCOR) 40 MG tablet TAKE 1 TABLET BY MOUTH  DAILY 90 tablet 3   testosterone cypionate (DEPOTESTOTERONE CYPIONATE) 100 MG/ML injection Inject 200 mg into the muscle once a week. For IM use only     valACYclovir (VALTREX) 1000 MG tablet Take 1 tablet (1,000 mg total) by mouth 2 (two) times daily. 30 tablet 0   fluticasone (FLONASE) 50 MCG/ACT nasal spray USE 1 SPRAY IN EACH NOSTRIL EVERY DAY 32 g 11   No current facility-administered medications for this visit.    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    Employer: [employer]  Tobacco Use   Smoking status: Never   Smokeless tobacco: Never  Substance and Sexual Activity   Alcohol use: Yes    Comment: 1-2 drinks a day   Drug use: No   Sexual activity: Not on file  Other Topics Concern   Not on file   Social History Narrative   Not on file   Social Determinants of Health   Financial Resource Strain: Not on file  Food Insecurity: Not on file  Transportation Needs: Not on file  Physical Activity: Not on file  Stress: Not on file  Social Connections: Not on file        Objective:  Physical Exam: BP 120/80   Pulse 74   Temp 98 F (36.7 C)   Ht 5\' 11"  (1.803 m)   Wt 206 lb 9.6 oz (93.7 kg)   SpO2 94%   BMI 28.81 kg/m   Body mass index is 28.81 kg/m. Wt Readings from Last 3 Encounters:  11/28/21 206 lb 9.6 oz (93.7 kg)  09/28/20 206 lb 3.2 oz (93.5 kg)  04/01/20 208 lb 12.8 oz (94.7 kg)  Gen: NAD, resting comfortably HEENT: TMs normal bilaterally. OP clear. No thyromegaly noted.  CV: RRR with no murmurs appreciated Pulm: NWOB, CTAB with no crackles, wheezes, or rhonchi GI: Normal bowel sounds present. Soft, Nontender, Nondistended. MSK: no edema, cyanosis, or clubbing noted Skin: warm, dry Neuro: CN2-12 grossly intact. Strength 5/5 in upper and lower extremities. Reflexes symmetric and intact bilaterally.  Psych: Normal affect and thought content     Ermias Tomeo M. [name], MD 11/28/2021 8:26 AM

## 2021-11-28 NOTE — Assessment & Plan Note (Signed)
Check A1c. 

## 2021-11-28 NOTE — Assessment & Plan Note (Signed)
Check lipids.  He is currently on simvastatin 40 mg daily doing well.  He did have a coronary calcium score of 0 last year.  We had a brief discussion about continuing with statin given his score of 0.  At this point he is tolerating well without any significant side effects and would like to keep going with current dose for now.  We will continue to monitor his lipids annually and we can repeat his coronary artery calcium score in 5 to 10 years.

## 2021-11-28 NOTE — Assessment & Plan Note (Signed)
No red flags.  [facility].  May benefit from hearing aid.

## 2021-11-28 NOTE — Assessment & Plan Note (Signed)
Stable on over-the-counter meds as needed. 

## 2021-11-28 NOTE — Assessment & Plan Note (Signed)
On testosterone replacement per urology.  Gets PSA screening through them.

## 2021-11-28 NOTE — Assessment & Plan Note (Signed)
Takes Ativan as needed prior to plane rides and Pregnyl as needed for public speaking.  Does not need refills today.  Medications help with his phobia.  No significant side effects.  Will refill as needed.

## 2021-11-30 NOTE — Progress Notes (Signed)
Please inform patient of the following:  His labs are all stable. Do not need to make any changes to his treatment plan at this time. He should continue to work on diet and exercise and we can recheck in a year.

## 2021-12-22 IMAGING — CT CT CARDIAC CORONARY ARTERY CALCIUM SCORE
3 series · 14 of 20 positions shown, 15 images · non-contrast
Comparison: None.
COMPARISON: None.

Addendum:
EXAM:
OVER-READ INTERPRETATION  CT CHEST

The following report is an over-read performed by radiologist Dr.
Charal[place]ia Saparilla [REDACTED] on 11/08/2020. This
over-read does not include interpretation of cardiac or coronary
anatomy or pathology. The coronary calcium score/coronary CTA
interpretation by the cardiologist is attached.
CLINICAL DATA: Cardiovascular Disease Risk stratification
Coronary Calcium Score
TECHNIQUE: A gated, non-contrast computed tomography scan of the heart was
performed using 3mm slice thickness. Axial images were analyzed on a
dedicated workstation. Calcium scoring of the coronary arteries was
performed using the Agatston method.

[Series 2: casc 3.0 bv41 2 bestdiast 73 % · axial · 0.45mm/px · z∈[-160,-88]mm · 4 of 40 slices shown, 5 images]
[im 8/40  vessel]
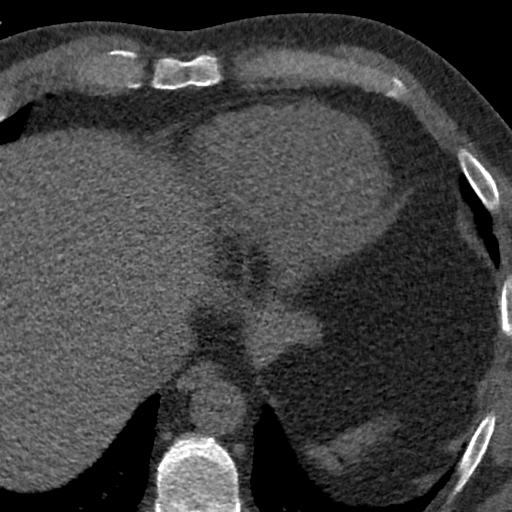
[im 8/40  lung]
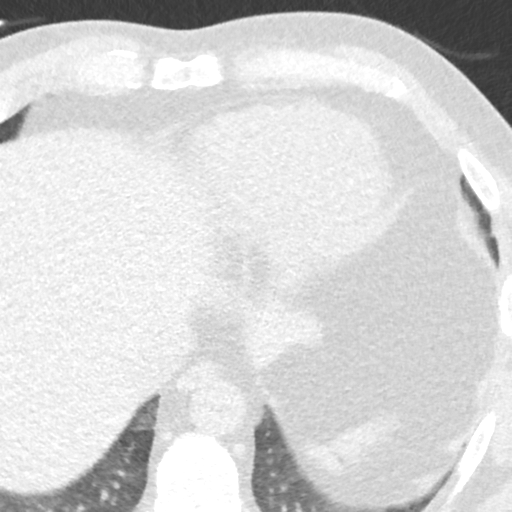
[im 16/40  vessel]
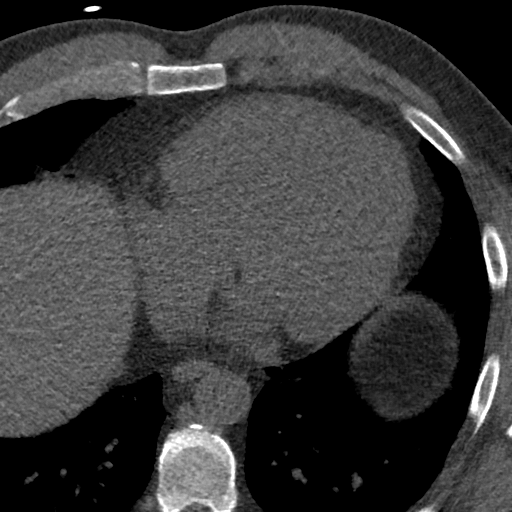
[im 24/40  vessel]
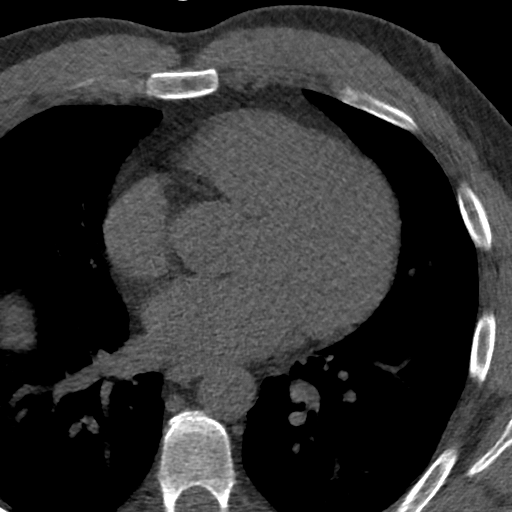
[im 32/40  vessel]
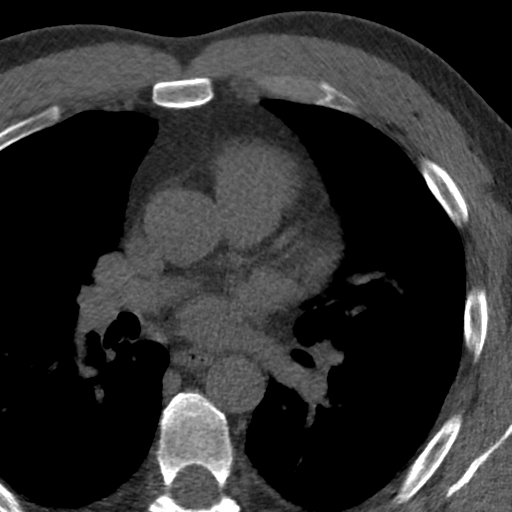

[Series 3: lung 73 % · axial · 0.68mm/px · z∈[-163,-85]mm · 5 of 40 slices shown]
[im 7/40  lung]
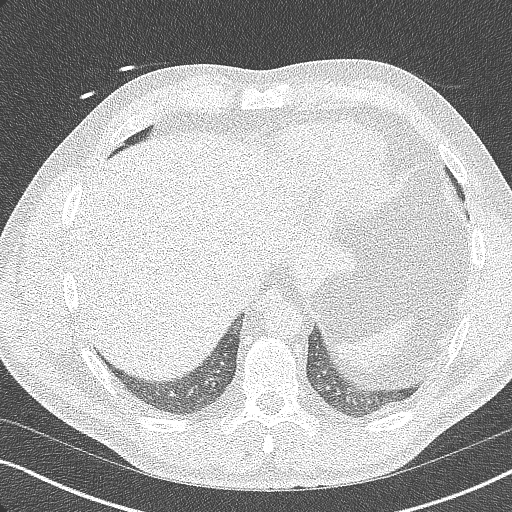
[im 14/40  lung]
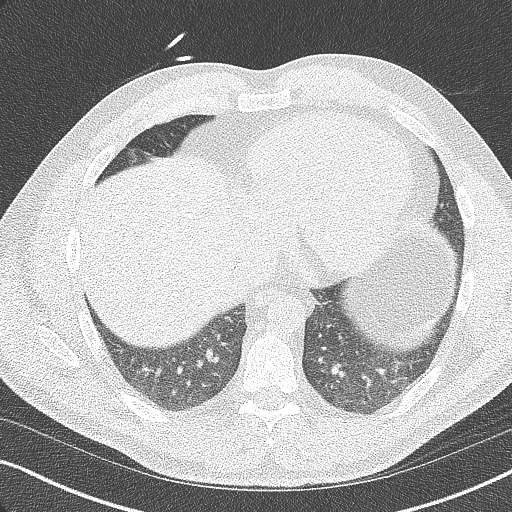
[im 20/40  lung]
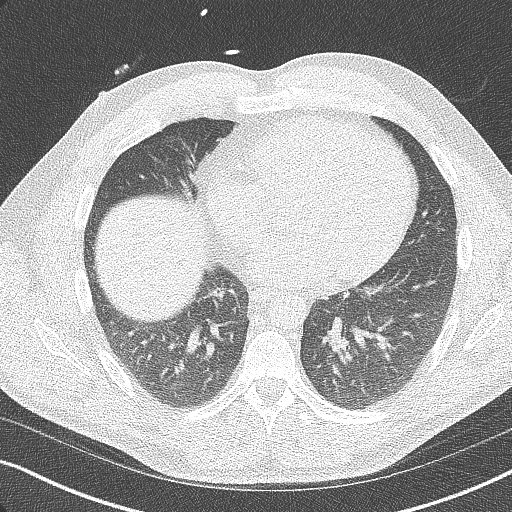
[im 27/40  lung]
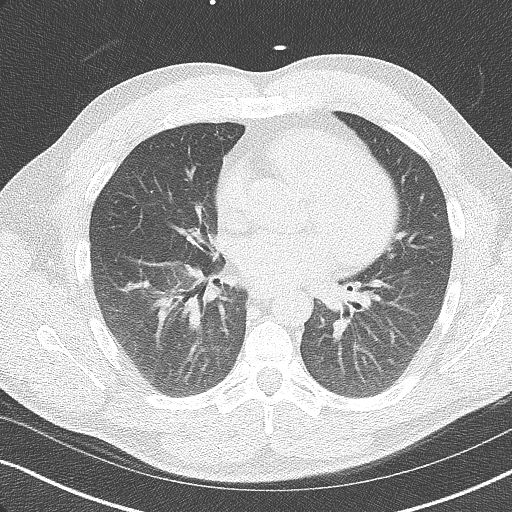
[im 33/40  lung]
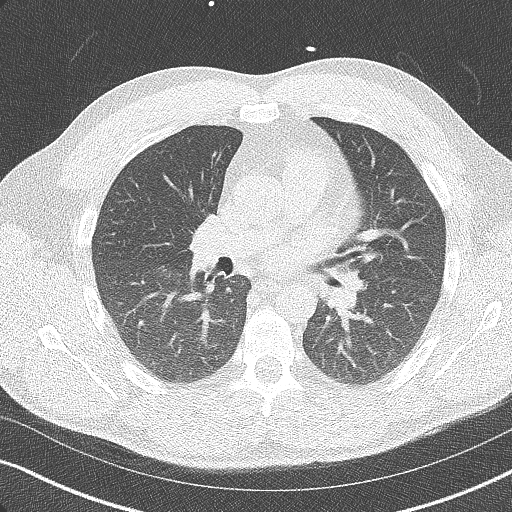

[Series 4: lung st 73 % · axial · 0.68mm/px · z∈[-163,-85]mm · 5 of 40 slices shown]
[im 7/40  lung]
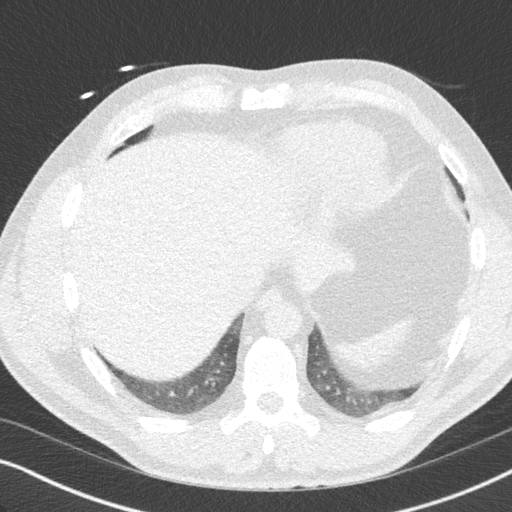
[im 14/40  lung]
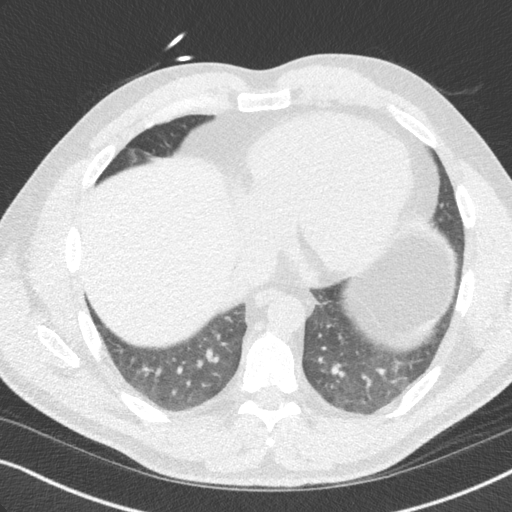
[im 20/40  lung]
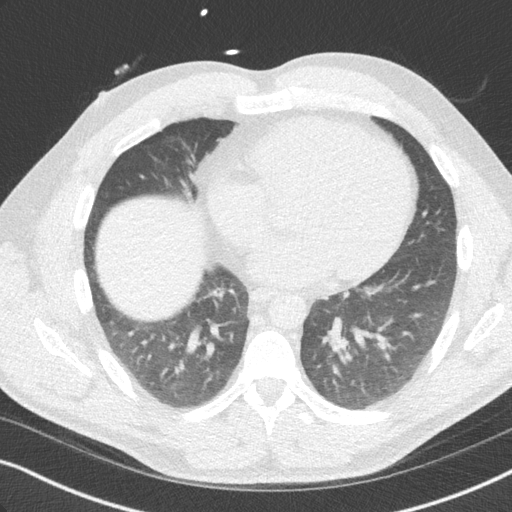
[im 27/40  lung]
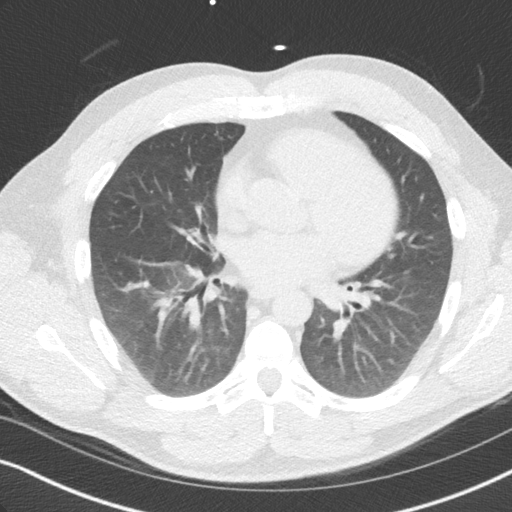
[im 33/40  lung]
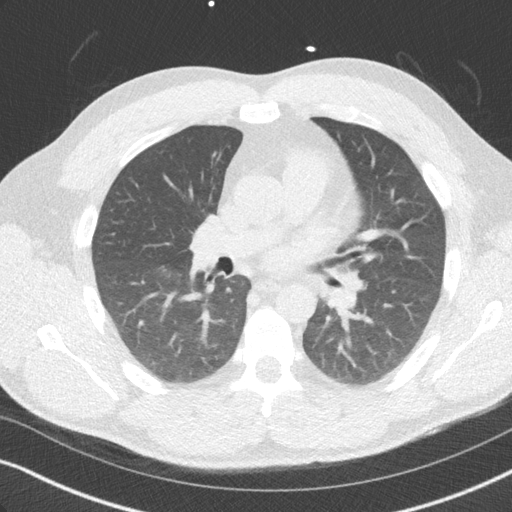

[14 of 20 positions shown; findings below may reference images not displayed]

FINDINGS: Within the visualized portions of the thorax there are no suspicious
appearing pulmonary nodules or masses, there is no acute
consolidative airspace disease, no pleural effusions, no
pneumothorax and no lymphadenopathy. Visualized portions of the
upper abdomen are unremarkable. There are no aggressive appearing
lytic or blastic lesions noted in the visualized portions of the
skeleton.
IMPRESSION: 1. No significant incidental noncardiac findings are noted.
FINDINGS: Coronary arteries: Normal origins.

Coronary Calcium Score:

Left main: 0

Left anterior descending artery: 0

Left circumflex artery: 0

Right coronary artery: 0

Total: 0

Percentile: 0

Pericardium: Normal.

Ascending Aorta: Normal caliber.

Non-cardiac: See separate report from [REDACTED].
IMPRESSION: Coronary calcium score of 0. This is a low risk study.



If CAC=0, it is reasonable to withhold statin therapy and reassess
in 5 to 10 years, as long as higher risk conditions are absent
(diabetes mellitus, family history of premature CHD in first degree
relatives (males <55 years; females <65 years), cigarette smoking,
or LDL >=190 mg/dL).

If CAC is 1 to 99, it is reasonable to initiate statin therapy for
patients >=55 years of age.

If CAC is >=100 or >=75th percentile, it is reasonable to initiate
statin therapy at any age.

Cardiology referral should be considered for patients with CAC
scores >=400 or >=75th percentile.

*0339 AHA/ACC/AACVPR/AAPA/ABC/BILLIOT/YING CRACKER/SEITZ/Franciene/LAZO CRUZ/KP/HEAD
Guideline on the Management of Blood Cholesterol: A Report of the
American College of Cardiology/American Heart Association Task Force
on Clinical Practice Guidelines. J Am Coll Cardiol.
7960;73(24):3943-3658.

*** End of Addendum ***
EXAM:
OVER-READ INTERPRETATION  CT CHEST

The following report is an over-read performed by radiologist Dr.
Charalambia Saparilla [REDACTED] on 11/08/2020. This
over-read does not include interpretation of cardiac or coronary
anatomy or pathology. The coronary calcium score/coronary CTA
interpretation by the cardiologist is attached.
FINDINGS: Within the visualized portions of the thorax there are no suspicious
appearing pulmonary nodules or masses, there is no acute
consolidative airspace disease, no pleural effusions, no
pneumothorax and no lymphadenopathy. Visualized portions of the
upper abdomen are unremarkable. There are no aggressive appearing
lytic or blastic lesions noted in the visualized portions of the
skeleton.
IMPRESSION: 1. No significant incidental noncardiac findings are noted.

## 2022-01-22 ENCOUNTER — Encounter: Payer: Self-pay | Admitting: *Deleted

## 2022-04-12 ENCOUNTER — Encounter: Payer: Self-pay | Admitting: *Deleted

## 2022-05-23 ENCOUNTER — Other Ambulatory Visit: Payer: Self-pay | Admitting: *Deleted

## 2022-05-23 ENCOUNTER — Other Ambulatory Visit: Payer: Self-pay | Admitting: Family Medicine

## 2022-06-20 ENCOUNTER — Other Ambulatory Visit: Payer: Self-pay | Admitting: Family Medicine

## 2022-08-20 ENCOUNTER — Other Ambulatory Visit: Payer: Self-pay | Admitting: Family Medicine

## 2022-08-21 LAB — PSA: PSA: 0.77

## 2022-08-21 LAB — TESTOSTERONE: Testosterone: 381.8

## 2022-08-30 ENCOUNTER — Encounter: Payer: Self-pay | Admitting: Family Medicine

## 2022-11-18 ENCOUNTER — Other Ambulatory Visit: Payer: Self-pay | Admitting: Family Medicine

## 2022-11-19 NOTE — Telephone Encounter (Signed)
Pt requesting refill for Lorazepam 1 mg. Last OV 11/28/2021.

## 2022-11-26 ENCOUNTER — Other Ambulatory Visit: Payer: Self-pay | Admitting: Family Medicine

## 2023-01-14 ENCOUNTER — Other Ambulatory Visit: Payer: Self-pay | Admitting: Family Medicine

## 2023-01-21 ENCOUNTER — Other Ambulatory Visit: Payer: Self-pay | Admitting: Family Medicine

## 2023-05-10 ENCOUNTER — Other Ambulatory Visit: Payer: Self-pay | Admitting: Family Medicine

## 2023-05-16 ENCOUNTER — Other Ambulatory Visit: Payer: Self-pay | Admitting: Family Medicine

## 2023-05-27 LAB — HM COLONOSCOPY

## 2023-05-31 ENCOUNTER — Other Ambulatory Visit: Payer: Self-pay | Admitting: Family Medicine

## 2023-06-21 ENCOUNTER — Ambulatory Visit (INDEPENDENT_AMBULATORY_CARE_PROVIDER_SITE_OTHER): Payer: 59 | Admitting: Family Medicine

## 2023-06-21 ENCOUNTER — Encounter: Payer: Self-pay | Admitting: Family Medicine

## 2023-06-21 VITALS — BP 136/86 | HR 74 | Temp 98.6°F | Ht 71.0 in | Wt 210.4 lb

## 2023-06-21 DIAGNOSIS — Z0001 Encounter for general adult medical examination with abnormal findings: Secondary | ICD-10-CM | POA: Diagnosis not present

## 2023-06-21 DIAGNOSIS — H9311 Tinnitus, right ear: Secondary | ICD-10-CM

## 2023-06-21 DIAGNOSIS — Z8619 Personal history of other infectious and parasitic diseases: Secondary | ICD-10-CM

## 2023-06-21 DIAGNOSIS — R739 Hyperglycemia, unspecified: Secondary | ICD-10-CM | POA: Diagnosis not present

## 2023-06-21 DIAGNOSIS — Z125 Encounter for screening for malignant neoplasm of prostate: Secondary | ICD-10-CM | POA: Diagnosis not present

## 2023-06-21 DIAGNOSIS — G4730 Sleep apnea, unspecified: Secondary | ICD-10-CM

## 2023-06-21 DIAGNOSIS — I10 Essential (primary) hypertension: Secondary | ICD-10-CM | POA: Insufficient documentation

## 2023-06-21 DIAGNOSIS — F40298 Other specified phobia: Secondary | ICD-10-CM

## 2023-06-21 DIAGNOSIS — E7800 Pure hypercholesterolemia, unspecified: Secondary | ICD-10-CM

## 2023-06-21 DIAGNOSIS — R030 Elevated blood-pressure reading, without diagnosis of hypertension: Secondary | ICD-10-CM

## 2023-06-21 LAB — COMPREHENSIVE METABOLIC PANEL
ALT: 27 U/L (ref 0–53)
AST: 22 U/L (ref 0–37)
Albumin: 4.5 g/dL (ref 3.5–5.2)
Alkaline Phosphatase: 53 U/L (ref 39–117)
BUN: 18 mg/dL (ref 6–23)
CO2: 31 meq/L (ref 19–32)
Calcium: 9.3 mg/dL (ref 8.4–10.5)
Chloride: 101 meq/L (ref 96–112)
Creatinine, Ser: 1.32 mg/dL (ref 0.40–1.50)
GFR: 60.36 mL/min (ref >60.00)
Glucose, Bld: 95 mg/dL (ref 70–99)
Potassium: 4.4 meq/L (ref 3.5–5.1)
Sodium: 139 meq/L (ref 135–145)
Total Bilirubin: 0.6 mg/dL (ref 0.2–1.2)
Total Protein: 7.1 g/dL (ref 6.0–8.3)

## 2023-06-21 LAB — CBC
HCT: 47.4 % (ref 39.0–52.0)
Hemoglobin: 16 g/dL (ref 13.0–17.0)
MCHC: 33.7 g/dL (ref 30.0–36.0)
MCV: 95 fL (ref 78.0–100.0)
Platelets: 189 10*3/uL (ref 150.0–400.0)
RBC: 4.99 Mil/uL (ref 4.22–5.81)
RDW: 13.4 % (ref 11.5–15.5)
WBC: 3.9 10*3/uL — ABNORMAL LOW (ref 4.0–10.5)

## 2023-06-21 LAB — LIPID PANEL
Cholesterol: 169 mg/dL (ref 0–200)
HDL: 55.5 mg/dL (ref >39.00)
LDL Cholesterol: 97 mg/dL (ref 0–99)
NonHDL: 113.43
Total CHOL/HDL Ratio: 3
Triglycerides: 81 mg/dL (ref 0.0–149.0)
VLDL: 16.2 mg/dL (ref 0.0–40.0)

## 2023-06-21 LAB — HEMOGLOBIN A1C: Hgb A1c MFr Bld: 5.6 % (ref 4.6–6.5)

## 2023-06-21 LAB — TSH: TSH: 2.39 u[IU]/mL (ref 0.35–5.50)

## 2023-06-21 LAB — PSA: PSA: 3.87 ng/mL (ref 0.10–4.00)

## 2023-06-21 MED ORDER — PROPRANOLOL HCL 20 MG PO TABS: ORAL_TABLET | ORAL | 0 refills | Status: AC

## 2023-06-21 MED ORDER — VALACYCLOVIR HCL 1 G PO TABS: 1000.0000 mg | ORAL_TABLET | Freq: Two times a day (BID) | ORAL | 5 refills | Status: AC

## 2023-06-21 MED ORDER — LORAZEPAM 1 MG PO TABS: ORAL_TABLET | ORAL | 0 refills | Status: AC

## 2023-06-21 MED ORDER — ACYCLOVIR 200 MG PO CAPS: 200.0000 mg | ORAL_CAPSULE | Freq: Every day | ORAL | 3 refills | Status: DC

## 2023-06-21 NOTE — Patient Instructions (Signed)
 It was very nice to see you today!  We will check blood work today.  I will refill your medications.  Please monitor your blood pressure at home and let us know if persistently elevated 140/90 or higher.  We will refer you for a sleep study.  Please continue to work on diet and exercise.  Return in about 1 year (around 06/20/2024) for Annual Physical.   Take care, Dr [name]  PLEASE NOTE:  If you had any lab tests, please let us know if you have not heard back within a few days. You may see your results on mychart before we have a chance to review them but we will give you a call once they are reviewed by [place].   If we ordered any referrals today, please let us know if you have not heard from their office within the next week.   If you had any urgent prescriptions sent in today, please check with the pharmacy within an hour of our visit to make sure the prescription was transmitted appropriately.   Please try these tips to maintain a healthy lifestyle:  Eat at least 3 REAL meals and 1-2 snacks per day.  Aim for no more than 5 hours between eating.  If you eat breakfast, please do so within one hour of getting up.   Each meal should contain half fruits/vegetables, one quarter protein, and one quarter carbs (no bigger than a computer mouse)  Cut down on sweet beverages. This includes juice, soda, and sweet tea.   Drink at least 1 glass of water with each meal and aim for at least 8 glasses per day  Exercise at least 150 minutes every week.    Preventive Care 22-23 Years Old, Male Preventive care refers to lifestyle choices and visits with your health care provider that can promote health and wellness. Preventive care visits are also called wellness exams. What can I expect for my preventive care visit? Counseling During your preventive care visit, your health care provider may ask about your: Medical history, including: Past medical problems. Family medical history. Current  health, including: Emotional well-being. Home life and relationship well-being. Sexual activity. Lifestyle, including: Alcohol, nicotine or tobacco, and drug use. Access to firearms. Diet, exercise, and sleep habits. Safety issues such as seatbelt and bike helmet use. Sunscreen use. Work and work Astronomer. Physical exam Your health care provider will check your: Height and weight. These may be used to calculate your BMI (body mass index). BMI is a measurement that tells if you are at a healthy weight. Waist circumference. This measures the distance around your waistline. This measurement also tells if you are at a healthy weight and may help predict your risk of certain diseases, such as type 2 diabetes and high blood pressure. Heart rate and blood pressure. Body temperature. Skin for abnormal spots. What immunizations do I need?  Vaccines are usually given at various ages, according to a schedule. Your health care provider will recommend vaccines for you based on your age, medical history, and lifestyle or other factors, such as travel or where you work. What tests do I need? Screening Your health care provider may recommend screening tests for certain conditions. This may include: Lipid and cholesterol levels. Diabetes screening. This is done by checking your blood sugar (glucose) after you have not eaten for a while (fasting). Hepatitis B test. Hepatitis C test. HIV (human immunodeficiency virus) test. STI (sexually transmitted infection) testing, if you are at risk. Lung cancer screening.  Prostate cancer screening. Colorectal cancer screening. Talk with your health care provider about your test results, treatment options, and if necessary, the need for more tests. Follow these instructions at home: Eating and drinking  Eat a diet that includes fresh fruits and vegetables, whole grains, lean protein, and low-fat dairy products. Take vitamin and mineral supplements as  recommended by your health care provider. Do not drink alcohol if your health care provider tells you not to drink. If you drink alcohol: Limit how much you have to 0-2 drinks a day. Know how much alcohol is in your drink. In the [place] one drink equals one 12 oz bottle of beer (355 mL), one 5 oz glass of wine (148 mL), or one 1 oz glass of hard liquor (44 mL). Lifestyle Brush your teeth every morning and night with fluoride toothpaste. Floss one time each day. Exercise for at least 30 minutes 5 or more days each week. Do not use any products that contain nicotine or tobacco. These products include cigarettes, chewing tobacco, and vaping devices, such as e-cigarettes. If you need help quitting, ask your health care provider. Do not use drugs. If you are sexually active, practice safe sex. Use a condom or other form of protection to prevent STIs. Take aspirin only as told by your health care provider. Make sure that you understand how much to take and what form to take. Work with your health care provider to find out whether it is safe and beneficial for you to take aspirin daily. Find healthy ways to manage stress, such as: Meditation, yoga, or listening to music. Journaling. Talking to a trusted person. Spending time with friends and family. Minimize exposure to UV radiation to reduce your risk of skin cancer. Safety Always wear your seat belt while driving or riding in a vehicle. Do not drive: If you have been drinking alcohol. Do not ride with someone who has been drinking. When you are tired or distracted. While texting. If you have been using any mind-altering substances or drugs. Wear a helmet and other protective equipment during sports activities. If you have firearms in your house, make sure you follow all gun safety procedures. What's next? Go to your health care provider once a year for an annual wellness visit. Ask your health care provider how often you should have your eyes  and teeth checked. Stay up to date on all vaccines. This information is not intended to replace advice given to you by your health care provider. Make sure you discuss any questions you have with your health care provider. Document Revised: 10/12/2020 Document Reviewed: 10/12/2020 Elsevier Patient Education  2024 [employer].

## 2023-06-21 NOTE — Assessment & Plan Note (Signed)
 Stable on acyclovir 200 mg daily and Valtrex 2000 mg daily as needed for outbreaks.  Will refill today.

## 2023-06-21 NOTE — Progress Notes (Signed)
 Chief Complaint:  Jeremy Paul is a 58 y.o. male who presents today for his annual comprehensive physical exam.    Assessment/Plan:  Chronic Problems Addressed Today: Hyperlipidemia Check lipids.  He is on simvastatin 40 mg daily.  Coronary artery calcium score of 0 in 2022.  We can repeat in 2027.  Specific phobia Uses Ativan as needed prior to plane rides and propranolol as needed prior to public speaking.  Tolerates well.  No significant side effects.  Will refill today.  Hyperglycemia Check A1c.  Discussed lifestyle modifications.  Sleep-disordered breathing Patient is concerned about possible OSA.  He does have a app that monitors his sleep which indicates this as well.  Will refer for sleep study.  H/O cold sores Stable on acyclovir 200 mg daily and Valtrex 2000 mg daily as needed for outbreaks.  Will refill today.  Tinnitus Overall symptoms are manageable.  We did discuss referral to audiology for hearing aids however he declined.  Elevated blood pressure reading Elevated today.  Typically well-controlled.  Typically well-controlled at home as well.  He will monitor at home and let us know if persistently elevated.  We are treating as above concern for OSA which may be contributing as well.  We discussed lifestyle interventions including regular exercise and low-sodium diet.  He is already doing great job with this.  Preventative Healthcare: Check labs.  Up-to-date on colon cancer screening.  He will get shingles vaccine at the pharmacy.  Patient Counseling(The following topics were reviewed and/or handout was given):  -Nutrition: Stressed importance of moderation in sodium/caffeine intake, saturated fat and cholesterol, caloric balance, sufficient intake of fresh fruits, vegetables, and fiber.  -Stressed the importance of regular exercise.   -Substance Abuse: Discussed cessation/primary prevention of tobacco, alcohol, or other drug use; driving or other dangerous activities  under the influence; availability of treatment for abuse.   -Injury prevention: Discussed safety belts, safety helmets, smoke detector, smoking near bedding or upholstery.   -Sexuality: Discussed sexually transmitted diseases, partner selection, use of condoms, avoidance of unintended pregnancy and contraceptive alternatives.   -Dental health: Discussed importance of regular tooth brushing, flossing, and dental visits.  -Health maintenance and immunizations reviewed. Please refer to Health maintenance section.  Return to care in 1 year for next preventative visit.     Subjective:  HPI:  He has no acute complaints today. See Assessment / plan for status of chronic conditions.   Lifestyle Diet: None specific.  Exercise: Weight lifiting and cardio     06/21/2023    8:34 AM  Depression screen PHQ 2/9  Decreased Interest 0  Down, Depressed, Hopeless 0  PHQ - 2 Score 0    There are no preventive care reminders to display for this patient.   ROS: Per HPI, otherwise a complete review of systems was negative.   PMH:  The following were reviewed and entered/updated in epic: Past Medical History:  Diagnosis Date   Allergic rhinitis    Anxiety    Eosinophilic esophagitis    GERD (gastroesophageal reflux disease)    Herpes simplex    Hyperlipidemia    Patient Active Problem List   Diagnosis Date Noted   Sleep-disordered breathing 06/21/2023   Elevated blood pressure reading 06/21/2023   Hyperglycemia 12/09/2018   Tinnitus 05/20/2018   H/O cold sores 08/21/2012   Hypogonadism, male 05/29/2011   Family history of colonic polyps 02/13/2011   Hyperlipidemia 05/08/2007   Specific phobia 05/08/2007   Allergic rhinitis 05/08/2007   GERD 05/08/2007  Past Surgical History:  Procedure Laterality Date   INGUINAL HERNIA REPAIR  1989   right    Family History  Problem Relation Age of Onset   Hypertension Father    Thyroid disease Mother    Colon polyps Mother    Alcohol  abuse Brother    Colon polyps Brother    Skin cancer Maternal Uncle    Heart disease Paternal Grandmother    Prostate cancer Neg Hx    Colon cancer Neg Hx     Medications- reviewed and updated Current Outpatient Medications  Medication Sig Dispense Refill   esomeprazole (NEXIUM) 20 MG capsule Take 20 mg by mouth daily at 12 noon.     simvastatin (ZOCOR) 40 MG tablet TAKE 1 TABLET BY MOUTH DAILY 30 tablet 0   testosterone cypionate (DEPOTESTOTERONE CYPIONATE) 100 MG/ML injection Inject 200 mg into the muscle once a week. For IM use only     acyclovir (ZOVIRAX) 200 MG capsule Take 1 capsule (200 mg total) by mouth daily. 90 capsule 3   LORazepam (ATIVAN) 1 MG tablet TAKE 1 TABLET BY MOUTH 1 HOUR PRIOR TO AIRPLANE TRAVEL 20 tablet 0   propranolol (INDERAL) 20 MG tablet TAKE 1 TABLET BY MOUTH 1  HOUR PRIOR TO SPEAKING  ENGAGEMENT. MAY TAKE UP TO  TWICE DAILY 180 tablet 0   valACYclovir (VALTREX) 1000 MG tablet Take 1 tablet (1,000 mg total) by mouth 2 (two) times daily. 90 tablet 5   No current facility-administered medications for this visit.    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    Employer: [employer]  Tobacco Use   Smoking status: Never   Smokeless tobacco: Never  Substance and Sexual Activity   Alcohol use: Yes    Comment: 1-2 drinks a day   Drug use: No   Sexual activity: Not on file  Other Topics Concern   Not on file  Social History Narrative   Not on file   Social Drivers of Health   Financial Resource Strain: Not on file  Food Insecurity: Not on file  Transportation Needs: Not on file  Physical Activity: Not on file  Stress: Not on file  Social Connections: Not on file         Objective:  Physical Exam: BP 136/86   Pulse 74   Temp 98.6 F (37 C) (Temporal)   Ht 5\' 11"  (1.803 m)   Wt 210 lb 6.4 oz (95.4 kg)   SpO2 96%   BMI 29.34 kg/m   Body mass index is 29.34 kg/m. Wt Readings from Last 3 Encounters:  06/21/23 210 lb 6.4 oz (95.4 kg)  11/28/21 206 lb 9.6 oz (93.7 kg)  09/28/20 206 lb 3.2 oz (93.5 kg)   Gen: NAD, resting comfortably HEENT: TMs Jeremy bilaterally. OP clear. No thyromegaly noted.  CV: RRR with no murmurs appreciated Pulm: NWOB, CTAB with no crackles, wheezes, or rhonchi GI: Jeremy bowel sounds present. Soft, Nontender, Nondistended. MSK: no edema, cyanosis, or clubbing noted Skin: warm, dry Neuro: CN2-12 grossly intact. Strength 5/5 in upper and lower extremities. Reflexes symmetric and intact bilaterally.  Psych: Jeremy affect and thought content     Orpheus Hayhurst M. [name],  MD 06/21/2023 9:28 AM

## 2023-06-21 NOTE — Assessment & Plan Note (Signed)
 Patient is concerned about possible OSA.  He does have a app that monitors his sleep which indicates this as well.  Will refer for sleep study.

## 2023-06-21 NOTE — Assessment & Plan Note (Signed)
 Uses Ativan as needed prior to plane rides and propranolol as needed prior to public speaking.  Tolerates well.  No significant side effects.  Will refill today.

## 2023-06-21 NOTE — Assessment & Plan Note (Signed)
 Check A1c.  Discussed lifestyle modifications.

## 2023-06-21 NOTE — Assessment & Plan Note (Signed)
 Overall symptoms are manageable.  We did discuss referral to audiology for hearing aids however he declined.

## 2023-06-21 NOTE — Assessment & Plan Note (Signed)
 Elevated today.  Typically well-controlled.  Typically well-controlled at home as well.  He will monitor at home and let us know if persistently elevated.  We are treating as above concern for OSA which may be contributing as well.  We discussed lifestyle interventions including regular exercise and low-sodium diet.  He is already doing great job with this.

## 2023-06-21 NOTE — Assessment & Plan Note (Signed)
 Check lipids.  He is on simvastatin 40 mg daily.  Coronary artery calcium score of 0 in 2022.  We can repeat in 2027.

## 2023-06-24 ENCOUNTER — Encounter: Payer: Self-pay | Admitting: Family Medicine

## 2023-06-24 NOTE — Progress Notes (Signed)
 His labs are all at goal.  Do not need to make any changes to his treatment plan at this time.  He should keep up the great work with diet and exercise and we can recheck again in a year or so.

## 2023-06-24 NOTE — Progress Notes (Signed)
 Last read by Carlis Stable at  1:56 PM on 06/24/2023

## 2023-06-28 ENCOUNTER — Other Ambulatory Visit: Payer: Self-pay | Admitting: Family Medicine

## 2023-07-12 ENCOUNTER — Ambulatory Visit: Payer: Self-pay | Admitting: Family Medicine

## 2023-07-12 ENCOUNTER — Encounter: Payer: Self-pay | Admitting: Family Medicine

## 2023-07-12 NOTE — Telephone Encounter (Signed)
 Appreciate the update.  We can start a low-dose blood pressure medication for the time being.  I hope if we can get sleep apnea treated he will not need to be on blood pressure medications long-term.  Please send amlodipine 5 mg daily if he is agreeable to start.  I would like for him to follow-up with [place] in a few weeks.

## 2023-07-12 NOTE — Telephone Encounter (Signed)
  Chief Complaint: elevated blood pressure Symptoms: Denies Frequency: Ongoing since last OV on 06/21/23 Pertinent Negatives: Patient denies all symptoms Disposition: [] ED /[] Urgent Care (no appt availability in office) / [x] Appointment(In office/virtual)/ []  [facility]/ [] Home Care/ [] Refused Recommended Disposition /[] [place]/ []  Follow-up with PCP Additional Notes:  Patient returning call for triage.  Calling to schedule visit and make Dr. [name] aware that his blood pressures have sustained elevated. During last visit Arlys John and Dr. [name] were "discussing blood pressure and sleep apnea". He is scheduled for sleep study to rule out/in sleep apnea. He has been following diet modifications, limiting sodium, increased physical activity. He has been monitoring his blood pressures at home and seems to be sustaining an average of 150/90. Most recent blood pressure is 146/86 this was around 1pm today. He is not experiencing any symptoms at this time. Advised PCP follow up within 2 weeks, patient accepts appointment on March 17. Care advice discussed per protocol. Discussed reasons to call back and reasons to seek EMS.   Reason for Disposition  [1] Systolic BP  >= 130 OR Diastolic >= 80 AND [2] not taking BP medications  Answer Assessment - Initial Assessment Questions 1. BLOOD PRESSURE: "What is the blood pressure?" "Did you take at least two measurements 5 minutes apart?"     150/90, 146/86 2. ONSET: "When did you take your blood pressure?"     30 minutes ago 3. HOW: "How did you take your blood pressure?" (e.g., automatic home BP monitor, visiting nurse)     Home cuff 4. HISTORY: "Do you have a history of high blood pressure?"     No 5. MEDICINES: "Are you taking any medicines for blood pressure?" "Have you missed any doses recently?"     None 6. OTHER SYMPTOMS: "Do you have any symptoms?" (e.g., blurred vision, chest pain, difficulty breathing, headache, weakness)      Asymptomatic.  Protocols used: Blood Pressure - High-A-AH

## 2023-07-12 NOTE — Telephone Encounter (Addendum)
 This RN made 2nd attempt to reach pt, "call cannot be completed as dialed message. Routing to clinic for follow up.  This RN made 2nd attempt to reach pt, "call cannot be completed as dialed message.  This RN made first attempt to reach pt, "call cannot be completed as dialed" message.  Copied from CRM [id number]. Topic: Clinical - Medical Advice >> Jul 12, 2023 10:21 AM Isabell A wrote: Reason for CRM: Patient states he's concerned about his high blood pressure - his last reading was 150/90 around 10:15am.Patient would like to speak with a nurse for clinical advice.  Callback number: [phone]

## 2023-07-15 ENCOUNTER — Ambulatory Visit (INDEPENDENT_AMBULATORY_CARE_PROVIDER_SITE_OTHER): Admitting: Family Medicine

## 2023-07-15 ENCOUNTER — Encounter: Payer: Self-pay | Admitting: Family Medicine

## 2023-07-15 VITALS — BP 130/80 | HR 70 | Temp 98.1°F | Ht 71.0 in | Wt 208.6 lb

## 2023-07-15 DIAGNOSIS — I10 Essential (primary) hypertension: Secondary | ICD-10-CM | POA: Diagnosis not present

## 2023-07-15 DIAGNOSIS — G4730 Sleep apnea, unspecified: Secondary | ICD-10-CM | POA: Diagnosis not present

## 2023-07-15 MED ORDER — AMLODIPINE BESYLATE 5 MG PO TABS: 5.0000 mg | ORAL_TABLET | Freq: Every day | ORAL | 3 refills | Status: AC

## 2023-07-15 NOTE — Patient Instructions (Addendum)
 It was very nice to see you today!  Your blood pressure is at goal today.  We will send in amlodipine.  Please start this if your blood pressure is still elevated at home.  Please continue to work on your diet and exercise.  Let me know if you weeks how you are doing  Return if symptoms worsen or fail to improve.   Take care, Dr [name]  PLEASE NOTE:  If you had any lab tests, please let us know if you have not heard back within a few days. You may see your results on mychart before we have a chance to review them but we will give you a call once they are reviewed by [place].   If we ordered any referrals today, please let us know if you have not heard from their office within the next week.   If you had any urgent prescriptions sent in today, please check with the pharmacy within an hour of our visit to make sure the prescription was transmitted appropriately.   Please try these tips to maintain a healthy lifestyle:  Eat at least 3 REAL meals and 1-2 snacks per day.  Aim for no more than 5 hours between eating.  If you eat breakfast, please do so within one hour of getting up.   Each meal should contain half fruits/vegetables, one quarter protein, and one quarter carbs (no bigger than a computer mouse)  Cut down on sweet beverages. This includes juice, soda, and sweet tea.   Drink at least 1 glass of water with each meal and aim for at least 8 glasses per day  Exercise at least 150 minutes every week.

## 2023-07-15 NOTE — Assessment & Plan Note (Signed)
 He has OSA evaluation later this week.  Anticipate that this will improve blood pressure readings if he does get treated for his potentially underlying OSA.

## 2023-07-15 NOTE — Assessment & Plan Note (Addendum)
 Had a lengthy discussion with patient today regarding his blood pressure.  It is at goal today though he has had some elevations at home.  Discussed normal fluctuations of blood pressure.   We also discussed lifestyle modifications.  He is working on diet and exercise including low-sodium diet.  Anticipate that his blood pressure will naturally come down as he loses weight and gets treated for his likely underlying OSA.  Do not think that he will need to be on antihypertensives long-term at this point though did discuss at some point later in life this is a possibility especially with his family history.  We will send prescription for amlodipine 5 mg daily.  We discussed potential side effects.  He will start this if blood pressures at home are persistently 140/90 or higher.  He will continue to monitor at home and follow-up with [place] in a few weeks via MyChart.

## 2023-07-15 NOTE — Progress Notes (Signed)
   Jeremy Paul is a 58 y.o. male who presents today for an office visit.  Assessment/Plan:  Chronic Problems Addressed Today: Essential hypertension Had a lengthy discussion with patient today regarding his blood pressure.  It is at goal today though he has had some elevations at home.  Discussed normal fluctuations of blood pressure.   We also discussed lifestyle modifications.  He is working on diet and exercise including low-sodium diet.  Anticipate that his blood pressure will naturally come down as he loses weight and gets treated for his likely underlying OSA.  Do not think that he will need to be on antihypertensives long-term at this point though did discuss at some point later in life this is a possibility especially with his family history.  We will send prescription for amlodipine 5 mg daily.  We discussed potential side effects.  He will start this if blood pressures at home are persistently 140/90 or higher.  He will continue to monitor at home and follow-up with [place] in a few weeks via MyChart.  Sleep-disordered breathing He has OSA evaluation later this week.  Anticipate that this will improve blood pressure readings if he does get treated for his potentially underlying OSA.     Subjective:  HPI:  See A/P for status of chronic conditions.  Patient is here today for follow-up.  Last saw him about a month ago for annual physical.  At that time blood pressure was stable at 136/86.  Since our last visit he has been monitoring at home and his blood pressures have been persistently in the 140s over 90s.  We have referred him for a sleep study however this consult is not for another couple of days.  His blood pressure has come down a little bit the last couple of days.  He has been trying work on low-sodium diet.  He is very active at home with his exercises.  Does have a family history of hypertension.       Objective:  Physical Exam: BP 130/80   Pulse 70   Temp 98.1 F (36.7 C)  (Temporal)   Ht 5\' 11"  (1.803 m)   Wt 208 lb 9.6 oz (94.6 kg)   SpO2 98%   BMI 29.09 kg/m   Gen: No acute distress, resting comfortably CV: Regular rate and rhythm with no murmurs appreciated Pulm: Normal work of breathing, clear to auscultation bilaterally with no crackles, wheezes, or rhonchi Neuro: Grossly normal, moves all extremities Psych: Normal affect and thought content      Aylana Hirschfeld M. [name], MD 07/15/2023 11:37 AM

## 2023-07-17 ENCOUNTER — Encounter: Payer: Self-pay | Admitting: Neurology

## 2023-07-17 ENCOUNTER — Ambulatory Visit (INDEPENDENT_AMBULATORY_CARE_PROVIDER_SITE_OTHER): Admitting: Neurology

## 2023-07-17 VITALS — BP 132/84 | HR 66 | Ht 71.0 in | Wt 209.4 lb

## 2023-07-17 DIAGNOSIS — R0681 Apnea, not elsewhere classified: Secondary | ICD-10-CM | POA: Diagnosis not present

## 2023-07-17 DIAGNOSIS — Z9189 Other specified personal risk factors, not elsewhere classified: Secondary | ICD-10-CM | POA: Diagnosis not present

## 2023-07-17 DIAGNOSIS — R0683 Snoring: Secondary | ICD-10-CM | POA: Diagnosis not present

## 2023-07-17 DIAGNOSIS — E663 Overweight: Secondary | ICD-10-CM

## 2023-07-17 DIAGNOSIS — R030 Elevated blood-pressure reading, without diagnosis of hypertension: Secondary | ICD-10-CM

## 2023-07-17 NOTE — Patient Instructions (Signed)

## 2023-07-17 NOTE — Progress Notes (Signed)
 Subjective:    Patient ID: Jeremy Paul is a 58 y.o. male.  HPI    Huston Foley, MD, PhD [facility] [address] [address] [place], [place] [address]  Dear Dr. [name],  I saw your patient, Jeremy Paul, upon your kind request in my sleep clinic today for initial consultation of his sleep disorder, in particular, concern for underlying obstructive sleep apnea.  The patient is unaccompanied today.  As you know, Jeremy Paul is a 58 year old male with an underlying medical history of hyperlipidemia, elevated blood pressure values, tinnitus, anxiety, hyperglycemia, and overweight state, who reports snoring and witnessed apneas, per wife. His Epworth sleepiness score is 5 out of 24, fatigue severity score is 14 out of 63.  I reviewed your office note from 06/21/2023.  He reports that in the past he has woken up with a sense of gasping for air.  He denies recurrent nocturnal morning headaches.  He does not have nightly nocturia.  He is not aware of any family history of sleep apnea.  He does have a history of bruxism and uses a dentist made occlusive guard.  He works for [employer].  He works a hybrid position, some from home and some in the office.  He lives with his wife, they have grown children, 29 year old daughter who is on her own and 74 year old twin boys, who are in [place].  They have 2 small dogs in the household, the dogs do not sleep in the bedroom.  They do not have a TV in the bedroom.  He is typically a side sleeper.  He is a non-smoker and drinks alcohol regularly, about 2 glasses of wine per day on average.  He drinks caffeine, typically in the mornings, 2 cups of coffee, none after 2 PM typically.  He has a prescription for amlodipine which was called in recently but has not started it yet.  He has been keeping a log for his blood pressure.  Initial blood pressure was a little elevated today and repeat was better.  Bedtime is generally around 10 PM and  rise time typically 6 AM or latest by 7 AM.    His Past Medical History Is Significant For: Past Medical History:  Diagnosis Date   Allergic rhinitis    Anxiety    Eosinophilic esophagitis    GERD (gastroesophageal reflux disease)    Herpes simplex    Hyperlipidemia     His Past Surgical History Is Significant For: Past Surgical History:  Procedure Laterality Date   INGUINAL HERNIA REPAIR  1989   right    His Family History Is Significant For: Family History  Problem Relation Age of Onset   Thyroid disease Mother    Colon polyps Mother    Breast cancer Mother    Hypertension Father    Alcohol abuse Brother    Colon polyps Brother    Skin cancer Maternal Uncle    Hypertension Paternal Aunt    Heart disease Paternal Grandmother    Prostate cancer Neg Hx    Colon cancer Neg Hx     His Social History Is Significant For: Social History   Socioeconomic History   Marital status: Married    Spouse name: Not on file   Number of children: 3   Years of education: Not on file   Highest education level: Not on file  Occupational History   Occupation: Masters in [employer]    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[employer]  Tobacco Use  Smoking status: Never   Smokeless tobacco: Never  Vaping Use   Vaping status: Never Used  Substance and Sexual Activity   Alcohol use: Yes    Comment: 1-2 drinks a day   Drug use: Never   Sexual activity: Not on file  Other Topics Concern   Not on file  Social History Narrative   Caffiene 2 cups coffee am   Lives wife, 2 dogs    Working: Community education officer (day time)   Social Drivers of Corporate investment banker Strain: Not on Ship broker Insecurity: Not on file  Transportation Needs: Not on file  Physical Activity: Not on file  Stress: Not on file  Social Connections: Not on file    His Allergies Are:  No Known Allergies:   His Current Medications Are:  Outpatient Encounter Medications as of 07/17/2023  Medication Sig   acyclovir  (ZOVIRAX) 200 MG capsule Take 1 capsule (200 mg total) by mouth daily.   esomeprazole (NEXIUM) 20 MG capsule Take 20 mg by mouth daily at 12 noon.   simvastatin (ZOCOR) 40 MG tablet TAKE 1 TABLET BY MOUTH DAILY   testosterone cypionate (DEPOTESTOTERONE CYPIONATE) 100 MG/ML injection Inject 200 mg into the muscle once a week. For IM use only   amLODipine (NORVASC) 5 MG tablet Take 1 tablet (5 mg total) by mouth daily. (Patient not taking: Reported on 07/17/2023)   LORazepam (ATIVAN) 1 MG tablet TAKE 1 TABLET BY MOUTH 1 HOUR PRIOR TO AIRPLANE TRAVEL (Patient not taking: Reported on 07/17/2023)   propranolol (INDERAL) 20 MG tablet TAKE 1 TABLET BY MOUTH 1  HOUR PRIOR TO SPEAKING  ENGAGEMENT. MAY TAKE UP TO  TWICE DAILY (Patient not taking: Reported on 07/17/2023)   valACYclovir (VALTREX) 1000 MG tablet Take 1 tablet (1,000 mg total) by mouth 2 (two) times daily. (Patient not taking: Reported on 07/17/2023)   No facility-administered encounter medications on file as of 07/17/2023.  :   Review of Systems:  Out of a complete 14 point review of systems, all are reviewed and negative with the exception of these symptoms as listed below:   Review of Systems  Neurological:        Snoring, gasping at night.  Feels rested after 6-7 hr at night.  Routine sleep patterns. ESS 5,  FSS 14     Objective:  Neurological Exam  Physical Exam Physical Examination:   Vitals:   07/17/23 1251  BP: (!) 146/89  Pulse: 66    General Examination: The patient is a very pleasant 58 y.o. male in no acute distress. He appears well-developed and well-nourished and well groomed.   HEENT: Normocephalic, atraumatic, pupils are equal, round and reactive to light, extraocular tracking is good without limitation to gaze excursion or nystagmus noted. Hearing is grossly intact. Face is symmetric with normal facial animation. Speech is clear with no dysarthria noted. There is no hypophonia. There is no lip, neck/head, jaw or  voice tremor. Neck is supple with full range of passive and active motion. There are no carotid bruits on auscultation. Oropharynx exam reveals: No significant mouth dryness, good dental hygiene, moderate airway crowding secondary to small airway entry, tonsils and tip of uvula not fully visualized.  Mallampati class IV.  Neck circumference 17-3/8 inches.  Minimal overbite noted.  Tongue protrudes centrally and palate elevates symmetrically.   Chest: Clear to auscultation without wheezing, rhonchi or crackles noted.  Heart: S1+S2+0, regular and normal without murmurs, rubs or gallops noted.   Abdomen:  Soft, non-tender and non-distended.  Extremities: There is no pitting edema in the distal lower extremities bilaterally.   Skin: Warm and dry without trophic changes noted.   Musculoskeletal: exam reveals no obvious joint deformities.   Neurologically:  Mental status: The patient is awake, alert and oriented in all 4 spheres. His immediate and remote memory, attention, language skills and fund of knowledge are appropriate. There is no evidence of aphasia, agnosia, apraxia or anomia. Speech is clear with normal prosody and enunciation. Thought process is linear. Mood is normal and affect is normal.  Cranial nerves II - XII are as described above under HEENT exam.  Motor exam: Normal bulk, strength and tone is noted. There is no obvious action or resting tremor.  Fine motor skills and coordination: grossly intact.  Cerebellar testing: No dysmetria or intention tremor. There is no truncal or gait ataxia.  Sensory exam: intact to light touch in the upper and lower extremities.  Gait, station and balance: He stands easily. No veering to one side is noted. No leaning to one side is noted. Posture is age-appropriate and stance is narrow based. Gait shows normal stride length and normal pace. No problems turning are noted.   Assessment and Plan:   In summary, Jeremy Paul is a very pleasant 58 y.o.-year  old male with an underlying medical history of hyperlipidemia, elevated blood pressure values, tinnitus, anxiety, hyperglycemia, and overweight state, whose history and physical exam are concerning for sleep disordered breathing, particularly obstructive sleep apnea (OSA).  While a laboratory attended sleep study is typically considered "gold standard" for evaluation of sleep disordered breathing, the patient would prefer a home sleep test at this time.   I had a long chat with the patient about my findings and the diagnosis of sleep apnea, particularly OSA, its prognosis and treatment options. We talked about medical/conservative treatments, surgical interventions and non-pharmacological approaches for symptom control. I explained, in particular, the risks and ramifications of untreated moderate to severe OSA, especially with respect to developing cardiovascular disease down the road, including congestive heart failure (CHF), difficult to treat hypertension, cardiac arrhythmias (particularly A-fib), neurovascular complications including TIA, stroke and dementia. Even type 2 diabetes has, in part, been linked to untreated OSA. Symptoms of untreated OSA may include (but may not be limited to) daytime sleepiness, nocturia (i.e. frequent nighttime urination), memory problems, mood irritability and suboptimally controlled or worsening mood disorder such as depression and/or anxiety, lack of energy, lack of motivation, physical discomfort, as well as recurrent headaches, especially morning or nocturnal headaches. We talked about the importance of maintaining a healthy lifestyle and striving for healthy weight. In addition, we talked about the importance of striving for and maintaining good sleep hygiene. I recommended a sleep study at this time. I outlined the differences between a laboratory attended sleep study which is considered more comprehensive and accurate over the option of a home sleep test (HST); the latter  may lead to underestimation of sleep disordered breathing in some instances and does not help with diagnosing upper airway resistance syndrome and is not accurate enough to diagnose primary central sleep apnea typically. I outlined possible surgical and non-surgical treatment options of OSA, including the use of a positive airway pressure (PAP) device (i.e. CPAP, AutoPAP/APAP or BiPAP in certain circumstances), a custom-made dental device (aka oral appliance, which would require a referral to a specialist dentist or orthodontist typically, and is generally speaking not considered for patients with full dentures or edentulous state), upper airway surgical  options, such as traditional UPPP (which is not considered a first-line treatment) or the Inspire device (hypoglossal nerve stimulator, which would involve a referral for consultation with an ENT surgeon, after careful selection, following inclusion criteria - also not first-line treatment). I explained the PAP treatment option to the patient in detail, as this is generally considered first-line treatment.  The patient indicated that he would be willing to try PAP therapy, if the need arises. I explained the importance of being compliant with PAP treatment, not only for insurance purposes but primarily to improve patient's symptoms symptoms, and for the patient's long term health benefit, including to reduce His cardiovascular risks longer-term.    We will pick up our discussion about the next steps and treatment options after testing.  We will keep him posted as to the test results by phone call and/or MyChart messaging where possible.  We will plan to follow-up in sleep clinic accordingly as well.  I answered all his questions today and the patient was in agreement.   I encouraged him to call with any interim questions, concerns, problems or updates or email [place] through MyChart.  Generally speaking, sleep test authorizations may take up to 2 weeks, sometimes  less, sometimes longer, the patient is encouraged to get in touch with [place] if they do not hear back from the sleep lab staff directly within the next 2 weeks.  Thank you very much for allowing me to participate in the care of this nice patient. If I can be of any further assistance to you please do not hesitate to call me at [phone].  Sincerely,   Huston Foley, MD, PhD

## 2023-08-09 ENCOUNTER — Ambulatory Visit (INDEPENDENT_AMBULATORY_CARE_PROVIDER_SITE_OTHER): Admitting: Neurology

## 2023-08-09 DIAGNOSIS — R030 Elevated blood-pressure reading, without diagnosis of hypertension: Secondary | ICD-10-CM

## 2023-08-09 DIAGNOSIS — G4733 Obstructive sleep apnea (adult) (pediatric): Secondary | ICD-10-CM

## 2023-08-09 DIAGNOSIS — Z9189 Other specified personal risk factors, not elsewhere classified: Secondary | ICD-10-CM

## 2023-08-09 DIAGNOSIS — R0683 Snoring: Secondary | ICD-10-CM

## 2023-08-09 DIAGNOSIS — E663 Overweight: Secondary | ICD-10-CM

## 2023-08-09 DIAGNOSIS — R0681 Apnea, not elsewhere classified: Secondary | ICD-10-CM

## 2023-09-26 NOTE — Progress Notes (Signed)
 See procedure note.

## 2023-10-01 ENCOUNTER — Ambulatory Visit: Payer: Self-pay | Admitting: Neurology

## 2023-10-01 DIAGNOSIS — G4733 Obstructive sleep apnea (adult) (pediatric): Secondary | ICD-10-CM

## 2023-10-01 NOTE — Procedures (Signed)
 [facility]  HOME SLEEP TEST (SANSA) REPORT (Mail-Out Device):   STUDY DATE: 09/17/23  DOB: [birth year]  MRN: [id number]  ORDERING CLINICIAN: Debbra Fairy, MD, PhD   REFERRING CLINICIAN: Rodney Clamp, MD   CLINICAL INFORMATION/HISTORY: 58 year old male with an underlying medical history of hyperlipidemia, elevated blood pressure values, tinnitus, anxiety, hyperglycemia, and overweight state, who reports snoring and witnessed apneas.   PATIENT'S LAST REPORTED EPWORTH SLEEPINESS SCORE (ESS): 5/24.  BMI (at the time of sleep clinic visit and/or test date): 29.2 kg/m  FINDINGS:   Study Protocol:    The SANSA single-point-of-skin-contact chest-worn sensor - an FDA cleared and DOT approved type 4 home sleep test device - measures eight physiological channels,  including blood oxygen saturation (measured via PPG [photoplethysmography]), EKG-derived heart rate, respiratory effort, chest movement (measured via accelerometer), snoring, body position, and actigraphy. The device is designed to be worn for up to 10 hours per study.   Sleep Summary:   Total Recording Time (hours, min): 9 hours, 38 min  Total Effective Sleep Time (hours, min):  7 hours, 43 min  Sleep Efficiency (%):    80%   Respiratory Indices:   Calculated sAHI (per hour):  13.1/hour         Oxygen Saturation Statistics:    Oxygen Saturation (%) Mean: 93.7%   Minimum oxygen saturation (%):                 87.4%   O2 Saturation Range (%): 87.4- 99%   Time below or at 88% saturation: 0 min   Pulse Rate Statistics:   Pulse Mean (bpm):    61/min    Pulse Range (46-101/min)   Snoring: Intermittent, mild  IMPRESSION/DIAGNOSES:   OSA (obstructive sleep apnea), mild  RECOMMENDATIONS:   This home sleep test demonstrates overall mild obstructive sleep apnea with a total AHI of 13.1/hour and O2 nadir of 87.4%. Snoring was detected, intermittently, in the mild range. Given the patient's  medical history and sleep related complaints, therapy with a positive airway pressure device is clinically recommended. Treatment can be achieved in the form of autoPAP trial/titration at home for now. A full night, in-lab PAP titration study may aid in improving proper treatment settings and with mask fit, if needed, down the road. Alternative treatments may include weight loss (where appropriate) along with avoidance of the supine sleep position (if possible), or an oral appliance in appropriate candidates.   Please note that untreated obstructive sleep apnea may carry additional perioperative morbidity. Patients with significant obstructive sleep apnea should receive perioperative PAP therapy and the surgeons and particularly the anesthesiologist should be informed of the diagnosis and the severity of the sleep disordered breathing. The patient should be cautioned not to drive, work at heights, or operate dangerous or heavy equipment when tired or sleepy. Review and reiteration of good sleep hygiene measures should be pursued with any patient. Other causes of the patient's symptoms, including circadian rhythm disturbances, an underlying mood disorder, medication effect and/or an underlying medical problem cannot be ruled out based on this test. Clinical correlation is recommended.  The patient and his referring provider will be notified of the test results. The patient will be seen in follow up in sleep clinic at [facility], as necessary.  I certify that I have reviewed the raw data recording prior to the issuance of this report in accordance with the standards of the American Academy of Sleep Medicine (AASM).    INTERPRETING PHYSICIAN:   Debbra Fairy,  MD, PhD Medical Director, [facility]) Diplomat, ABPN (Neurology and Sleep)   [facility] [address] [place], [place] [address] [phone]

## 2023-10-03 NOTE — Telephone Encounter (Signed)
 Pt  retuning call from nurse . Inform Pt he will get a call back

## 2023-10-03 NOTE — Telephone Encounter (Signed)
 Called pt and LVM with office number and hours, asking for call back.

## 2023-10-03 NOTE — Telephone Encounter (Signed)
-----   Message from Debbra Fairy sent at 10/01/2023  6:18 PM EDT ----- Patient referred by PCP, seen by me on 07/17/2023, patient had HST on 09/17/2023.    Please call and notify the patient that the recent home sleep test showed obstructive sleep apnea. OSA is overall mild, but worth treating to see if he feels better after treatment. To that end I recommend treatment for this in the form of autoPAP, which means, that we don't have to bring him in for a sleep study with CPAP, but will let him try an autoPAP machine at home, through a DME company (of his choice, or as per insurance requirement). The DME representative will educate him on how to use the machine, how to put the mask on, etc. I have placed an order in the chart. Please send referral, talk to patient, send report to referring MD. We will need a FU in sleep clinic in about 2.-3 months post-PAP set up (which is usually an insurance-mandated appointment to monitor compliance), please arrange that with me or one of our NPs. Please also go over the need for compliance with treatment (including the insurance-imposed minimum compliance percentage). Thanks,   Debbra Fairy, MD, PhD [facility])

## 2023-10-08 NOTE — Telephone Encounter (Signed)
-----   Message from Debbra Fairy sent at 10/01/2023  6:18 PM EDT ----- Patient referred by PCP, seen by me on 07/17/2023, patient had HST on 09/17/2023.    Please call and notify the patient that the recent home sleep test showed obstructive sleep apnea. OSA is overall mild, but worth treating to see if he feels better after treatment. To that end I recommend treatment for this in the form of autoPAP, which means, that we don't have to bring him in for a sleep study with CPAP, but will let him try an autoPAP machine at home, through a DME company (of his choice, or as per insurance requirement). The DME representative will educate him on how to use the machine, how to put the mask on, etc. I have placed an order in the chart. Please send referral, talk to patient, send report to referring MD. We will need a FU in sleep clinic in about 2.-3 months post-PAP set up (which is usually an insurance-mandated appointment to monitor compliance), please arrange that with me or one of our NPs. Please also go over the need for compliance with treatment (including the insurance-imposed minimum compliance percentage). Thanks,   Debbra Fairy, MD, PhD [facility])

## 2023-10-08 NOTE — Telephone Encounter (Signed)
 Spoke to patient gave sleep study results Pt chose [facility] has DME Gave patient [facility] # Made patient f/u appointment 11/2023 with Dr Omar Bibber. Pt informed of insurance compliance. Pt expressed understanding and thanked me for calling Sent orders to [facility] this evening and forward sleep study to PCP

## 2023-10-22 ENCOUNTER — Telehealth: Payer: Self-pay | Admitting: Neurology

## 2023-10-22 NOTE — Telephone Encounter (Signed)
 Called patient to reschedule 12/26/23 appt due to MD being out. Patient states he has not received a call from [facility] regarding the autoPAP. I informed him that we had sent his orders to them on 10/08/23 and for him to reach out to see what's going on. I also said that I would see if someone from our office can look into this as well to make sure nothing is needed on our end

## 2023-10-22 NOTE — Telephone Encounter (Signed)
 Sure I sent an urgent message to [employer] to check the status.

## 2023-10-22 NOTE — Telephone Encounter (Signed)
 New, Adine Boring, Heather CROME, RN; Joylene Carlean Jackson Avelina; Tucker, Dolanda; Ziegler, Melissa; 1 other Hello,  The order is in process. Its in the qualification stage at this time.  Auth will be required and can take up to 2 weeks.  Thank you,  Brad New  ---------------  I asked for someone from [employer] to give pt a welcome call/update.

## 2023-12-10 ENCOUNTER — Encounter: Payer: Self-pay | Admitting: Neurology

## 2023-12-10 NOTE — Telephone Encounter (Signed)
 RE: cpap where are we on this please Received: Today New, Adine Neysa Nena GORMAN, RN; Joylene Carlean Sheree Leveda Jackson Easter Delsa Eleanor; 1 other Hello Nena,  There was an issues with this order. The Clinton Wahlberg lady that processed this order has spoke to the patient today. We will get the patient scheduled soon.  Thank you,  Brad New     Previous Messages    ----- Message ----- From: Neysa Nena GORMAN, RN Sent: 12/10/2023   1:09 PM EDT To: Adine Joylene; Avelina Jackson; Ephraim Dollar* Subject: cpap where are we on this please              Good afternoon,   pt contacting us  about cpap status  Jeremy Paul Male, 58 y.o., 03-26-1966 MRN: [id number]   Thanks [name] RN

## 2023-12-10 NOTE — Telephone Encounter (Signed)
 I sent a community message to [facility] to see where we are on things.

## 2023-12-26 ENCOUNTER — Encounter: Admitting: Neurology

## 2024-01-06 ENCOUNTER — Encounter: Admitting: Neurology

## 2024-02-19 ENCOUNTER — Telehealth: Payer: Self-pay | Admitting: *Deleted

## 2024-02-19 ENCOUNTER — Ambulatory Visit (INDEPENDENT_AMBULATORY_CARE_PROVIDER_SITE_OTHER): Admitting: Neurology

## 2024-02-19 ENCOUNTER — Encounter: Payer: Self-pay | Admitting: Neurology

## 2024-02-19 VITALS — BP 118/71 | HR 66 | Ht 70.0 in | Wt 208.2 lb

## 2024-02-19 DIAGNOSIS — G4700 Insomnia, unspecified: Secondary | ICD-10-CM

## 2024-02-19 DIAGNOSIS — G4733 Obstructive sleep apnea (adult) (pediatric): Secondary | ICD-10-CM | POA: Diagnosis not present

## 2024-02-19 NOTE — Telephone Encounter (Signed)
 Copied from CRM #[id number]. Topic: Clinical - Medication Question >> Feb 18, 2024 10:35 AM Dedra B wrote: Reason for CRM: Pt has been on lorazepam  before he flies and wants to know if he can have the dosage increased or an alternative because the current dose has been ineffective. Pt said he is happy discuss options via MyChart or phone.

## 2024-02-19 NOTE — Progress Notes (Signed)
 Subjective:    Patient ID: Jeremy Paul is a 58 y.o. male.  HPI    Interim history:   Jeremy Paul is a 58 year old male with an underlying medical history of hyperlipidemia, elevated blood pressure values, tinnitus, anxiety, hyperglycemia, and overweight state, who presents for follow-up consultation of his obstructive sleep apnea after interim testing and starting home AutoPap therapy.  The patient is unaccompanied today.  I first met him at the request of his primary care physician on 07/17/2023, at which time he reported witnessed apneas as well as snoring.  He was advised to proceed with a sleep study.  He had a home sleep test through our office on 09/17/2023 which showed overall mild obstructive sleep apnea with a total AHI of 13.1/hour and O2 nadir of 87.4%. Snoring was detected, intermittently, in the mild range.  He was encouraged to start home AutoPap therapy.  His set up date was 12/06/2023.  He has a ResMed AirSense 11 AutoSet machine.  His DME provider is [facility].  Today, 02/19/2024: I reviewed his AutoPap compliance data from 01/19/2024 through 02/17/2024, which is a total of 30 days, during which time he used his machine 25 days with percent use days greater than 4 hours at 53%, indicating mildly suboptimal compliance with an average usage of 5 hours and 17 minutes for days on treatment, residual AHI at goal at 0.9/h, 95th percentile of pressure at 8.4 cm with a range of 6 to 12 cm with EPR of 2.  Leak acceptable with a 95th percentile at 16.7 L/min.  He reports still adjusting to treatment, so far it is going quite well but he does have a tendency to sometimes pull the mask off inadvertently in the middle of the night, sometimes when he cannot sleep through the night he will go downstairs and may doze off downstairs.  He is quite motivated to continue with treatment and has noticed benefit including better sleep quality and consolidation, and feedback from his wife is also positive especially  since he no longer snores when he is on the machine.  She encourages him to use it consistently.  He is using a fullface mask but is wondering if he should try an nasal interface such as the nasal pillows.  He is encouraged to get in touch with his DME provider about this.  He has been on amlodipine  consistently and is no longer taking propranolol  as needed.  He takes melatonin as needed, was taking it fairly consistently for about a month or so but currently not nightly.   The patient's allergies, current medications, family history, past medical history, past social history, past surgical history and problem list were reviewed and updated as appropriate.   Previously:  07/17/2023: (He) reports snoring and witnessed apneas, per wife. His Epworth sleepiness score is 5 out of 24, fatigue severity score is 14 out of 63.  I reviewed your office note from 06/21/2023.  He reports that in the past he has woken up with a sense of gasping for air.  He denies recurrent nocturnal morning headaches.  He does not have nightly nocturia.  He is not aware of any family history of sleep apnea.  He does have a history of bruxism and uses a dentist made occlusive guard.  He works for [employer].  He works a hybrid position, some from home and some in the office.  He lives with his wife, they have grown children, 23 year old daughter who is on her own and 36 year old  twin boys, who are in [place].  They have 2 small dogs in the household, the dogs do not sleep in the bedroom.  They do not have a TV in the bedroom.  He is typically a side sleeper.  He is a non-smoker and drinks alcohol regularly, about 2 glasses of wine per day on average.  He drinks caffeine, typically in the mornings, 2 cups of coffee, none after 2 PM typically.  He has a prescription for amlodipine  which was called in recently but has not started it yet.  He has been keeping a log for his blood pressure.  Initacial blood pressure was a little elevated  today and repeat was better.  Bedtime is generally around 10 PM and rise time typically 6 AM or latest by 7 AM.    His Past Medical History Is Significant For: Past Medical History:  Diagnosis Date   Allergic rhinitis    Anxiety    Eosinophilic esophagitis    GERD (gastroesophageal reflux disease)    Herpes simplex    Hyperlipidemia     His Past Surgical History Is Significant For: Past Surgical History:  Procedure Laterality Date   INGUINAL HERNIA REPAIR  1989   right    His Family History Is Significant For: Family History  Problem Relation Age of Onset   Thyroid  disease Mother    Colon polyps Mother    Breast cancer Mother    Hypertension Father    Alcohol abuse Brother    Colon polyps Brother    Skin cancer Maternal Uncle    Hypertension Paternal Aunt    Heart disease Paternal Grandmother    Prostate cancer Neg Hx    Colon cancer Neg Hx    Sleep apnea Neg Hx     His Social History Is Significant For: Social History   Socioeconomic History   Marital status: Married    Spouse name: Not on file   Number of children: 3   Years of education: Not on file   Highest education level: Not on file  Occupational History   Occupation: Masters in [employer]    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  Tobacco Use   Smoking status: Never   Smokeless tobacco: Never  Vaping Use   Vaping status: Never Used  Substance and Sexual Activity   Alcohol use: Yes    Alcohol/week: 15.0 standard drinks of alcohol    Types: 15 Shots of liquor per week    Comment: 1-2 drinks a day   Drug use: Never   Sexual activity: Not on file  Other Topics Concern   Not on file  Social History Narrative   Caffiene 2 cups coffee am   Lives wife, 2 dogs    Working: Community education officer (day time)   Social Drivers of Corporate investment banker Strain: Not on file  Food Insecurity: Not on file  Transportation Needs: Not on file  Physical Activity: Not on file  Stress: Not on file  Social  Connections: Not on file    His Allergies Are:  No Known Allergies:   His Current Medications Are:  Outpatient Encounter Medications as of 02/19/2024  Medication Sig   acyclovir  (ZOVIRAX ) 200 MG capsule Take 1 capsule (200 mg total) by mouth daily.   amLODipine  (NORVASC ) 5 MG tablet Take 1 tablet (5 mg total) by mouth daily.   esomeprazole  (NEXIUM ) 20 MG capsule Take 20 mg by mouth daily at 12 noon.   simvastatin  (ZOCOR ) 40 MG tablet TAKE 1  TABLET BY MOUTH DAILY   testosterone  cypionate (DEPOTESTOTERONE CYPIONATE) 100 MG/ML injection Inject 200 mg into the muscle once a week. For IM use only   valACYclovir  (VALTREX ) 1000 MG tablet Take 1 tablet (1,000 mg total) by mouth 2 (two) times daily. (Patient taking differently: Take 1,000 mg by mouth as needed.)   LORazepam  (ATIVAN ) 1 MG tablet TAKE 1 TABLET BY MOUTH 1 HOUR PRIOR TO AIRPLANE TRAVEL (Patient not taking: Reported on 02/19/2024)   propranolol  (INDERAL ) 20 MG tablet TAKE 1 TABLET BY MOUTH 1  HOUR PRIOR TO SPEAKING  ENGAGEMENT. MAY TAKE UP TO  TWICE DAILY (Patient not taking: Reported on 02/19/2024)   No facility-administered encounter medications on file as of 02/19/2024.  :  Review of Systems:  Out of a complete 14 point review of systems, all are reviewed and negative with the exception of these symptoms as listed below:  Review of Systems  Objective:  Neurological Exam  Physical Exam Physical Examination:   Vitals:   02/19/24 0919  BP: 118/71  Pulse: 66    General Examination: The patient is a very pleasant 58 y.o. male in no acute distress. He appears well-developed and well-nourished and well groomed.   HEENT: Normocephalic, atraumatic, pupils are equal, round and reactive to light, extraocular tracking is good without limitation to gaze excursion or nystagmus noted. Hearing is grossly intact. Face is symmetric with normal facial animation. Speech is clear without dysarthria, hypophonia or voice tremor, neck with full  range of motion, no carotid bruits.  Airway exam stable. Tongue protrudes centrally and palate elevates symmetrically.    Chest: Clear to auscultation without wheezing, rhonchi or crackles noted.   Heart: S1+S2+0, regular and normal without murmurs, rubs or gallops noted.    Abdomen: Soft, non-tender and non-distended.   Extremities: There is no obvious swelling in the distal lower extremities bilaterally.    Skin: Warm and dry without trophic changes noted.    Musculoskeletal: exam reveals no obvious joint deformities.    Neurologically:  Mental status: The patient is awake, alert and oriented in all 4 spheres. His immediate and remote memory, attention, language skills and fund of knowledge are appropriate. There is no evidence of aphasia, agnosia, apraxia or anomia. Speech is clear with normal prosody and enunciation. Thought process is linear. Mood is normal and affect is normal.  Cranial nerves II - XII are as described above under HEENT exam.  Motor exam: Normal bulk, moving all 4 extremities without restriction, no obvious action or resting tremor.  Fine motor skills and coordination: grossly intact.  Cerebellar testing: No dysmetria or intention tremor. There is no truncal or gait ataxia.   Gait, station and balance: He stands easily. No veering to one side is noted. No leaning to one side is noted. Posture is age-appropriate and stance is narrow based. Gait shows normal stride length and normal pace. No problems turning are noted.    Assessment and Plan:    In summary, Jeremy Paul is a 58 year old male with an underlying medical history of hyperlipidemia, elevated blood pressure values, tinnitus, anxiety, hyperglycemia, and overweight state, who presents for follow-up consultation of his obstructive sleep apnea after interim testing and starting home AutoPap therapy.  He had a home sleep test through our office on 09/17/2023 which showed an AHI of 13.1/hour and O2 nadir of 87.4%.   Has been on home AutoPap therapy since 12/06/2023.  He is still adjusting to treatment and has noticed benefit already, he is motivated to  continue with treatment.  We talked about the importance of compliance and consistency today.  He is utilizing melatonin as needed which he is encouraged to continue as needed.  He can certainly try a different interface and is encouraged to talk to his DME provider about trying a nasal pillows or cushion interface.  He is advised to follow-up routinely in this clinic in 1 year to see one of our nurse practitioners.  We can offer a MyChart video visit if he prefers as well.  I answered all his questions today, we talked about his sleep test results and reviewed his compliance data in detail today.  He was in agreement with our plan.  I spent 30 minutes in total face-to-face time and in reviewing records during pre-charting, more than 50% of which was spent in counseling and coordination of care, reviewing test results, reviewing medications and treatment regimen and/or in discussing or reviewing the diagnosis of OSA, the prognosis and treatment options. Pertinent laboratory and imaging test results that were available during this visit with the patient were reviewed by me and considered in my medical decision making (see chart for details).

## 2024-02-20 NOTE — Telephone Encounter (Signed)
 Since this is a controlled substance he will need an office visit. Virtual is ok.

## 2024-02-21 NOTE — Telephone Encounter (Signed)
 Please schedule an ov for medication refills

## 2024-02-21 NOTE — Telephone Encounter (Signed)
 LVM to schedule ov, can be virtual

## 2024-05-19 ENCOUNTER — Other Ambulatory Visit: Payer: Self-pay | Admitting: Family Medicine

## 2024-05-27 ENCOUNTER — Other Ambulatory Visit: Payer: Self-pay | Admitting: Family Medicine

## 2025-03-09 ENCOUNTER — Telehealth: Admitting: Family Medicine
